# Patient Record
Sex: Male | Born: 1960 | Race: Black or African American | Hispanic: No | Marital: Single | Smoking: Never smoker
Health system: Southern US, Community
[De-identification: ages and names within clinical notes are randomized; demographics above are authoritative.]

## PROBLEM LIST (undated history)

## (undated) DIAGNOSIS — M109 Gout, unspecified: Secondary | ICD-10-CM

## (undated) DIAGNOSIS — K5792 Diverticulitis of intestine, part unspecified, without perforation or abscess without bleeding: Secondary | ICD-10-CM

## (undated) HISTORY — PX: COLON SURGERY: SHX602

## (undated) HISTORY — PX: ORIF ULNAR FRACTURE: SHX5417

---

## 2010-12-10 ENCOUNTER — Ambulatory Visit (INDEPENDENT_AMBULATORY_CARE_PROVIDER_SITE_OTHER): Payer: Self-pay

## 2010-12-10 ENCOUNTER — Inpatient Hospital Stay (INDEPENDENT_AMBULATORY_CARE_PROVIDER_SITE_OTHER)
Admission: RE | Admit: 2010-12-10 | Discharge: 2010-12-10 | Disposition: A | Discharge status: Home / Self Care | Payer: Self-pay | Source: Ambulatory Visit | Attending: Family Medicine | Admitting: Family Medicine

## 2010-12-10 DIAGNOSIS — S335XXA Sprain of ligaments of lumbar spine, initial encounter: Secondary | ICD-10-CM

## 2010-12-10 DIAGNOSIS — S139XXA Sprain of joints and ligaments of unspecified parts of neck, initial encounter: Secondary | ICD-10-CM

## 2010-12-10 DIAGNOSIS — M1990 Unspecified osteoarthritis, unspecified site: Secondary | ICD-10-CM

## 2010-12-10 DIAGNOSIS — S8000XA Contusion of unspecified knee, initial encounter: Secondary | ICD-10-CM

## 2013-02-07 ENCOUNTER — Emergency Department (HOSPITAL_COMMUNITY): Payer: Self-pay

## 2013-02-07 ENCOUNTER — Emergency Department (INDEPENDENT_AMBULATORY_CARE_PROVIDER_SITE_OTHER): Payer: Medicare Other

## 2013-02-07 ENCOUNTER — Encounter (HOSPITAL_COMMUNITY): Payer: Self-pay | Admitting: Emergency Medicine

## 2013-02-07 ENCOUNTER — Emergency Department (INDEPENDENT_AMBULATORY_CARE_PROVIDER_SITE_OTHER)
Admission: EM | Admit: 2013-02-07 | Discharge: 2013-02-07 | Disposition: A | Discharge status: Home / Self Care | Payer: Medicare Other | Source: Home / Self Care

## 2013-02-07 DIAGNOSIS — S20219A Contusion of unspecified front wall of thorax, initial encounter: Secondary | ICD-10-CM

## 2013-02-07 DIAGNOSIS — W050XXA Fall from non-moving wheelchair, initial encounter: Secondary | ICD-10-CM

## 2013-02-07 DIAGNOSIS — S20212A Contusion of left front wall of thorax, initial encounter: Secondary | ICD-10-CM

## 2013-02-07 HISTORY — DX: Diverticulitis of intestine, part unspecified, without perforation or abscess without bleeding: K57.92

## 2013-02-07 HISTORY — DX: Gout, unspecified: M10.9

## 2013-02-07 MED ORDER — TRAMADOL HCL 50 MG PO TABS: 50.0000 mg | ORAL_TABLET | Freq: Four times a day (QID) | ORAL | Status: DC | PRN

## 2013-02-07 MED ORDER — IBUPROFEN 600 MG PO TABS: 600.0000 mg | ORAL_TABLET | Freq: Four times a day (QID) | ORAL | Status: DC | PRN
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
## 2013-02-08 NOTE — ED Provider Notes (Signed)
Medical screening examination/treatment/procedure(s) were performed by resident physician or non-physician practitioner and as supervising physician I was immediately available for consultation/collaboration.   KINDL,JAMES DOUGLAS MD.   James D Kindl, MD 02/08/13 1841 

## 2013-09-23 ENCOUNTER — Emergency Department (INDEPENDENT_AMBULATORY_CARE_PROVIDER_SITE_OTHER)
Admission: EM | Admit: 2013-09-23 | Discharge: 2013-09-23 | Disposition: A | Discharge status: Home / Self Care | Payer: Medicare Other | Source: Home / Self Care

## 2013-09-23 ENCOUNTER — Encounter (HOSPITAL_COMMUNITY): Payer: Self-pay | Admitting: Emergency Medicine

## 2013-09-23 DIAGNOSIS — K08133 Complete loss of teeth due to caries, class III: Secondary | ICD-10-CM

## 2013-09-23 DIAGNOSIS — K089 Disorder of teeth and supporting structures, unspecified: Secondary | ICD-10-CM

## 2013-09-23 DIAGNOSIS — K0889 Other specified disorders of teeth and supporting structures: Secondary | ICD-10-CM

## 2013-09-23 DIAGNOSIS — K08103 Complete loss of teeth, unspecified cause, class III: Secondary | ICD-10-CM

## 2013-09-23 DIAGNOSIS — K08139 Complete loss of teeth due to caries, unspecified class: Secondary | ICD-10-CM

## 2013-09-23 MED ORDER — AMOXICILLIN 500 MG PO CAPS: 1000.0000 mg | ORAL_CAPSULE | Freq: Two times a day (BID) | ORAL | Status: DC

## 2013-09-23 MED ORDER — HYDROCODONE-ACETAMINOPHEN 5-325 MG PO TABS: 1.0000 | ORAL_TABLET | ORAL | Status: DC | PRN

## 2013-09-23 NOTE — ED Provider Notes (Signed)
Medical screening examination/treatment/procedure(s) were performed by non-physician practitioner and as supervising physician I was immediately available for consultation/collaboration.  Demarri Elie, M.D.  Eulis Salazar C Jaxsin Bottomley, MD 09/23/13 2100 

## 2013-09-23 NOTE — Discharge Instructions (Signed)
Dental Pain °A tooth ache may be caused by cavities (tooth decay). Cavities expose the nerve of the tooth to air and hot or cold temperatures. It may come from an infection or abscess (also called a boil or furuncle) around your tooth. It is also often caused by dental caries (tooth decay). This causes the pain you are having. °DIAGNOSIS  °Your caregiver can diagnose this problem by exam. °TREATMENT  °· If caused by an infection, it may be treated with medications which kill germs (antibiotics) and pain medications as prescribed by your caregiver. Take medications as directed. °· Only take over-the-counter or prescription medicines for pain, discomfort, or fever as directed by your caregiver. °· Whether the tooth ache today is caused by infection or dental disease, you should see your dentist as soon as possible for further care. °SEEK MEDICAL CARE IF: °The exam and treatment you received today has been provided on an emergency basis only. This is not a substitute for complete medical or dental care. If your problem worsens or new problems (symptoms) appear, and you are unable to meet with your dentist, call or return to this location. °SEEK IMMEDIATE MEDICAL CARE IF:  °· You have a fever. °· You develop redness and swelling of your face, jaw, or neck. °· You are unable to open your mouth. °· You have severe pain uncontrolled by pain medicine. °MAKE SURE YOU:  °· Understand these instructions. °· Will watch your condition. °· Will get help right away if you are not doing well or get worse. °Document Released: 08/27/2005 Document Revised: 11/19/2011 Document Reviewed: 04/14/2008 °ExitCare® Patient Information ©2014 [employer]. ° °Dental Care and Dentist Visits °Dental care supports good overall health. Regular dental visits can also help you avoid dental pain, bleeding, infection, and other more serious health problems in the future. It is important to keep the mouth healthy because diseases in the teeth, gums,  and other oral tissues can spread to other areas of the body. Some problems, such as diabetes, heart disease, and pre-term labor have been associated with poor oral health.  °See your dentist every 6 months. If you experience emergency problems such as a toothache or broken tooth, go to the dentist right away. If you see your dentist regularly, you may catch problems early. It is easier to be treated for problems in the early stages.  °WHAT TO EXPECT AT A DENTIST VISIT  °Your dentist will look for many common oral health problems and recommend proper treatment. At your regular dental visit, you can expect: °· Gentle cleaning of the teeth and gums. This includes scraping and polishing. This helps to remove the sticky substance around the teeth and gums (plaque). Plaque forms in the mouth shortly after eating. Over time, plaque hardens on the teeth as tartar. If tartar is not removed regularly, it can cause problems. Cleaning also helps remove stains. °· Periodic X-rays. These pictures of the teeth and supporting bone will help your dentist assess the health of your teeth. °· Periodic fluoride treatments. Fluoride is a natural mineral shown to help strengthen teeth. Fluoride treatment involves applying a fluoride gel or varnish to the teeth. It is most commonly done in children. °· Examination of the mouth, tongue, jaws, teeth, and gums to look for any oral health problems, such as: °· Cavities (dental caries). This is decay on the tooth caused by plaque, sugar, and acid in the mouth. It is best to catch a cavity when it is small. °· Inflammation of the gums   caused by plaque buildup (gingivitis).  Problems with the mouth or malformed or misaligned teeth.  Oral cancer or other diseases of the soft tissues or jaws. KEEP YOUR TEETH AND GUMS HEALTHY For healthy teeth and gums, follow these general guidelines as well as your dentist's specific advice:  Have your teeth professionally cleaned at the dentist every 6  months.  Brush twice daily with a fluoride toothpaste.  Floss your teeth daily.  Ask your dentist if you need fluoride supplements, treatments, or fluoride toothpaste.  Eat a healthy diet. Reduce foods and drinks with added sugar.  Avoid smoking. TREATMENT FOR ORAL HEALTH PROBLEMS If you have oral health problems, treatment varies depending on the conditions present in your teeth and gums.  Your caregiver will most likely recommend good oral hygiene at each visit.  For cavities, gingivitis, or other oral health disease, your caregiver will perform a procedure to treat the problem. This is typically done at a separate appointment. Sometimes your caregiver will refer you to another dental specialist for specific tooth problems or for surgery. SEEK IMMEDIATE DENTAL CARE IF:  You have pain, bleeding, or soreness in the gum, tooth, jaw, or mouth area.  A permanent tooth becomes loose or separated from the gum socket.  You experience a blow or injury to the mouth or jaw area. Document Released: 05/09/2011 Document Revised: 11/19/2011 Document Reviewed: 05/09/2011 [facility] Patient Information 2014 [place], [place].  Teeth and Gum Care Most problems with teeth and gums can be prevented if you do the following:  Brush your teeth.  Brush after each meal or at least 3 times a day with a soft bristle tooth brush.  Brush in a circular motion.  Brush close to your gums as well as your teeth.  Brush your back teeth longer than your front teeth.  Floss your teeth once a day. Before bedtime is a good time to floss.  Go to a dentist 2 times a year.  Eat a balanced diet.  Eat good meals including fruits, vegetables, fish, chicken or other meat, and milk products.  Drink 8 to 10 glasses of water a day.  Do not eat candy or snack foods.  Do not  drink sugar-sweetened soft drinks or juice.  If a problem develops, go to your dentist right away.  Common problems are cavities, bleeding  gums, sores or lumps in your mouth, or pain in your teeth, gums or jaws.  It is important to see a Dentist right away for these problems because you may lose your teeth or have a lot of pain if you do not go soon.  Take care of your child's teeth.  Brush the teeth with a soft bristle tooth brush.  Do not let your child eat or drink sweet foods and drinks.  Do not let your child fall asleep with a bottle in his or her mouth.  Take your child to a dentist when they are between 882 and 51 years old. This is the age you should start taking your child to a dentist. If your child has problems with their teeth, take them to a dentist sooner.  If a tooth is knocked out (yours or your child's):  Save the tooth if possible.  Wrap it in tissue or put it in a glass of milk.  Take it with you to your dentist right away. Document Released: 06/05/2008 Document Revised: 11/19/2011 Document Reviewed: 06/05/2008 [facility]Exit[facility] Patient Information 2014 [place]ExitCare, [place].

## 2013-09-23 NOTE — ED Provider Notes (Signed)
CSN: [id number]     Arrival date & time 09/23/13  0802 History   None    Chief Complaint  Patient presents with  . Dental Pain   (Consider location/radiation/quality/duration/timing/severity/associated sxs/prior Treatment) HPI Comments: 53 year old male presents with toothache. He has a history of having all of his upper teeth pulled as well as most of the lower teeth.   Past Medical History  Diagnosis Date  . Diverticulitis   . Gout    Past Surgical History  Procedure Laterality Date  . Orif ulnar fracture     History reviewed. No pertinent family history. History  Substance Use Topics  . Smoking status: Never Smoker   . Smokeless tobacco: Not on file  . Alcohol Use: Yes    Review of Systems  Constitutional: Negative.   HENT: Positive for dental problem.        T  Respiratory: Negative.   Psychiatric/Behavioral: Negative.     Allergies  Review of patient's allergies indicates no known allergies.  Home Medications   Current Outpatient Rx  Name  Route  Sig  Dispense  Refill  . amoxicillin (AMOXIL) 500 MG capsule   Oral   Take 2 capsules (1,000 mg total) by mouth 2 (two) times daily.   28 capsule   0   . HYDROcodone-acetaminophen (NORCO/VICODIN) 5-325 MG per tablet   Oral   Take 1 tablet by mouth every 4 (four) hours as needed.   15 tablet   0   . ibuprofen (ADVIL,MOTRIN) 600 MG tablet   Oral   Take 1 tablet (600 mg total) by mouth every 6 (six) hours as needed for pain.   24 tablet   0   . traMADol (ULTRAM) 50 MG tablet   Oral   Take 1 tablet (50 mg total) by mouth every 6 (six) hours as needed for pain.   20 tablet   0    BP 147/94  Pulse 86  Temp(Src) 97.6 F (36.4 C) (Oral)  Resp 16  SpO2 98% Physical Exam  Nursing note and vitals reviewed. Constitutional: He is oriented to person, place, and time. He appears well-developed and well-nourished. No distress.  HENT:  Mouth/Throat: Oropharynx is clear and moist. No oropharyngeal exudate.   The remaining  teeth on the bottom numbering approximately 6 are tender with multiple caries and stages of decay. Gingiva mildly erythematous. No specific area of abscess visualized.   Eyes: Conjunctivae and EOM are normal.  Lymphadenopathy:    He has no cervical adenopathy.  Neurological: He is alert and oriented to person, place, and time. He exhibits normal muscle tone.  Skin: Skin is warm and dry.  Psychiatric: He has a normal mood and affect.    ED Course  Procedures (including critical care time) Labs Review Labs Reviewed - No data to display Imaging Review No results found.    MDM   1. Odontalgia   2. Loss of teeth due to cavities, class III edentulism   3. Poor dentition    Followup with a dentist for definitive treatment. In the meantime he may see the doctor listed on her Medicaid card as he will be able to prescribe pain medicines and antibiotics if needed. Norco 5 mg #15 Amoxicillin 4 times a day for 7 days    Hayden Rasmussenavid Mykela Mewborn, NP 09/23/13 [id number]  Hayden Rasmussenavid Shallen Luedke, NP 09/23/13 1759

## 2013-09-23 NOTE — ED Notes (Signed)
C/o dental pain which started two days ago States his bottom teeth are rotten and old He has removed all the top teeth b/c of rotten teeth States he doesn't have a dentist.  Needs bottom six teeth replaced States he mouth is swollen

## 2014-02-12 ENCOUNTER — Encounter: Payer: Self-pay | Admitting: Family Medicine

## 2014-02-12 ENCOUNTER — Ambulatory Visit (INDEPENDENT_AMBULATORY_CARE_PROVIDER_SITE_OTHER): Payer: Medicare Other | Admitting: Family Medicine

## 2014-02-12 VITALS — BP 124/81 | HR 92 | Temp 99.3°F | Ht 72.0 in | Wt 214.0 lb

## 2014-02-12 DIAGNOSIS — K5732 Diverticulitis of large intestine without perforation or abscess without bleeding: Secondary | ICD-10-CM

## 2014-02-12 DIAGNOSIS — M109 Gout, unspecified: Secondary | ICD-10-CM

## 2014-02-12 DIAGNOSIS — R7309 Other abnormal glucose: Secondary | ICD-10-CM | POA: Insufficient documentation

## 2014-02-12 DIAGNOSIS — E7800 Pure hypercholesterolemia, unspecified: Secondary | ICD-10-CM

## 2014-02-12 DIAGNOSIS — E785 Hyperlipidemia, unspecified: Secondary | ICD-10-CM | POA: Insufficient documentation

## 2014-02-12 LAB — CBC WITH DIFFERENTIAL/PLATELET
BASOS ABS: 0 10*3/uL (ref 0.0–0.1)
Basophils Relative: 0 % (ref 0–1)
EOS PCT: 1 % (ref 0–5)
Eosinophils Absolute: 0.1 10*3/uL (ref 0.0–0.7)
HCT: 48 % (ref 39.0–52.0)
Hemoglobin: 17.3 g/dL — ABNORMAL HIGH (ref 13.0–17.0)
LYMPHS PCT: 24 % (ref 12–46)
Lymphs Abs: 2.4 10*3/uL (ref 0.7–4.0)
MCH: 31 pg (ref 26.0–34.0)
MCHC: 36 g/dL (ref 30.0–36.0)
MCV: 86 fL (ref 78.0–100.0)
Monocytes Absolute: 0.9 10*3/uL (ref 0.1–1.0)
Monocytes Relative: 9 % (ref 3–12)
NEUTROS ABS: 6.7 10*3/uL (ref 1.7–7.7)
Neutrophils Relative %: 66 % (ref 43–77)
PLATELETS: 276 10*3/uL (ref 150–400)
RBC: 5.58 MIL/uL (ref 4.22–5.81)
RDW: 14 % (ref 11.5–15.5)
WBC: 10.2 10*3/uL (ref 4.0–10.5)

## 2014-02-12 LAB — COMPREHENSIVE METABOLIC PANEL
ALK PHOS: 56 U/L (ref 39–117)
ALT: 29 U/L (ref 0–53)
AST: 18 U/L (ref 0–37)
Albumin: 4.8 g/dL (ref 3.5–5.2)
BUN: 9 mg/dL (ref 6–23)
CALCIUM: 10.2 mg/dL (ref 8.4–10.5)
CHLORIDE: 100 meq/L (ref 96–112)
CO2: 26 mEq/L (ref 19–32)
CREATININE: 1.02 mg/dL (ref 0.50–1.35)
Glucose, Bld: 126 mg/dL — ABNORMAL HIGH (ref 70–99)
Potassium: 4.5 mEq/L (ref 3.5–5.3)
Sodium: 138 mEq/L (ref 135–145)
Total Bilirubin: 0.6 mg/dL (ref 0.2–1.2)
Total Protein: 7.4 g/dL (ref 6.0–8.3)

## 2014-02-12 LAB — POCT GLYCOSYLATED HEMOGLOBIN (HGB A1C): Hemoglobin A1C: 6.8

## 2014-02-12 LAB — LDL CHOLESTEROL, DIRECT: Direct LDL: 61 mg/dL
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
## 2014-02-12 NOTE — Patient Instructions (Signed)

## 2014-02-15 ENCOUNTER — Encounter: Payer: Self-pay | Admitting: Family Medicine

## 2014-02-17 ENCOUNTER — Encounter: Payer: Self-pay | Admitting: Family Medicine

## 2014-02-17 DIAGNOSIS — K5732 Diverticulitis of large intestine without perforation or abscess without bleeding: Secondary | ICD-10-CM | POA: Insufficient documentation

## 2014-02-17 NOTE — Assessment & Plan Note (Signed)
History of elevated cholesterol as his other doctor. Never prescribed medication. Was told to watch diet. Will repeat cholesterol panel today

## 2014-02-17 NOTE — Assessment & Plan Note (Signed)
Patient states he has been without diverticular flare for quite some time. He has not had a colonoscopy. Have recommended colonoscopy considering his 53 years old.

## 2014-02-17 NOTE — Assessment & Plan Note (Signed)
Obtain A1c today. 

## 2014-02-17 NOTE — Assessment & Plan Note (Signed)
Seems to be controlled on naproxen.

## 2014-03-18 ENCOUNTER — Encounter: Payer: Self-pay | Admitting: Family Medicine

## 2014-03-18 DIAGNOSIS — IMO0002 Reserved for concepts with insufficient information to code with codable children: Secondary | ICD-10-CM | POA: Insufficient documentation

## 2014-03-18 DIAGNOSIS — S62102A Fracture of unspecified carpal bone, left wrist, initial encounter for closed fracture: Secondary | ICD-10-CM | POA: Insufficient documentation

## 2014-03-23 ENCOUNTER — Encounter: Payer: Self-pay | Admitting: Family Medicine

## 2014-03-23 ENCOUNTER — Ambulatory Visit (INDEPENDENT_AMBULATORY_CARE_PROVIDER_SITE_OTHER): Payer: Medicare Other | Admitting: Family Medicine

## 2014-03-23 ENCOUNTER — Telehealth: Payer: Self-pay | Admitting: Family Medicine

## 2014-03-23 VITALS — BP 149/90 | HR 99 | Temp 98.0°F | Ht 72.0 in | Wt 214.7 lb

## 2014-03-23 DIAGNOSIS — Z0271 Encounter for disability determination: Secondary | ICD-10-CM

## 2014-03-23 NOTE — Telephone Encounter (Signed)
Please call Mr. Clayton Kennedy and inform him his paperwork is completed and placed behind the desk for pick up.Thanks.

## 2014-03-23 NOTE — Progress Notes (Signed)
Patient ID: Clayton Kennedy, male   DOB: 03/01/1961, 53 y.o.   MRN: [id number] No charge for today's visit. Patient is here for disability paper work. We personally dop not compete disability paperwork. We gave him the resources [facility].

## 2014-03-23 NOTE — Telephone Encounter (Signed)
LVM for patient to call back. ?

## 2014-03-25 NOTE — Telephone Encounter (Signed)
Patient informed. 

## 2015-03-26 ENCOUNTER — Emergency Department (INDEPENDENT_AMBULATORY_CARE_PROVIDER_SITE_OTHER)
Admission: EM | Admit: 2015-03-26 | Discharge: 2015-03-26 | Disposition: A | Discharge status: Home / Self Care | Payer: Commercial Managed Care - HMO | Source: Home / Self Care | Attending: Family Medicine | Admitting: Family Medicine

## 2015-03-26 ENCOUNTER — Encounter (HOSPITAL_COMMUNITY): Payer: Self-pay | Admitting: *Deleted

## 2015-03-26 DIAGNOSIS — L03011 Cellulitis of right finger: Secondary | ICD-10-CM | POA: Diagnosis not present

## 2015-03-26 MED ORDER — PENTAFLUOROPROP-TETRAFLUOROETH EX AERO
INHALATION_SPRAY | CUTANEOUS | Status: AC
Start: 2015-03-26 — End: 2015-03-26
  Filled 2015-03-26: qty 103.5

## 2015-03-26 MED ORDER — AMOXICILLIN-POT CLAVULANATE 875-125 MG PO TABS: 1.0000 | ORAL_TABLET | Freq: Two times a day (BID) | ORAL | Status: DC

## 2015-03-26 NOTE — ED Provider Notes (Signed)
CSN: [id number]     Arrival date & time 03/26/15  1357 History   First MD Initiated Contact with Patient 03/26/15 1434     Chief Complaint  Patient presents with  . Hand Pain   (Consider location/radiation/quality/duration/timing/severity/associated sxs/prior Treatment) Patient is a 54 y.o. male presenting with hand pain. The history is provided by the patient.  Hand Pain This is a new problem. The current episode started more than 1 week ago. The problem has been gradually worsening.    Past Medical History  Diagnosis Date  . Diverticulitis   . Gout    Past Surgical History  Procedure Laterality Date  . Orif ulnar fracture    . Colon surgery      Partial colectomy for diverticulitis   Family History  Problem Relation Age of Onset  . Cancer Mother   . Alcohol abuse Father    History  Substance Use Topics  . Smoking status: Never Smoker   . Smokeless tobacco: Not on file  . Alcohol Use: Yes    Review of Systems  Constitutional: Negative.   Musculoskeletal: Positive for joint swelling.  Skin: Positive for wound.    Allergies  Review of patient's allergies indicates no known allergies.  Home Medications   Prior to Admission medications   Medication Sig Start Date End Date Taking? Authorizing Provider  amoxicillin (AMOXIL) 500 MG capsule Take 2 capsules (1,000 mg total) by mouth 2 (two) times daily. 09/23/13   Hayden Rasmussenavid Mabe, NP  amoxicillin-clavulanate (AUGMENTIN) 875-125 MG per tablet Take 1 tablet by mouth 2 (two) times daily. 03/26/15   Linna HoffJames D Jaqueline Uber, MD  HYDROcodone-acetaminophen (NORCO/VICODIN) 5-325 MG per tablet Take 1 tablet by mouth every 4 (four) hours as needed. 09/23/13   Hayden Rasmussenavid Mabe, NP  ibuprofen (ADVIL,MOTRIN) 600 MG tablet Take 1 tablet (600 mg total) by mouth every 6 (six) hours as needed for pain. 02/07/13   Hayden Rasmussenavid Mabe, NP  traMADol (ULTRAM) 50 MG tablet Take 1 tablet (50 mg total) by mouth every 6 (six) hours as needed for pain. 02/07/13   Hayden Rasmussenavid Mabe, NP    BP 142/88 mmHg  Pulse 92  Temp(Src) 98.2 F (36.8 C) (Oral)  Resp 18  SpO2 96% Physical Exam  Constitutional: He is oriented to person, place, and time. He appears well-developed and well-nourished.  Musculoskeletal: He exhibits tenderness.  Abscess to right thumb cuticle area.  Neurological: He is alert and oriented to person, place, and time.  Skin: Skin is warm and dry. There is erythema.  Nursing note and vitals reviewed.   ED Course  INCISION AND DRAINAGE Date/Time: 03/26/2015 3:36 PM Performed by: Linna HoffKINDL, Dereck Agerton D Authorized by: Bradd CanaryKINDL, Taige Housman D Consent: Verbal consent obtained. Consent given by: patient Type: abscess Body area: upper extremity Location details: right thumb Local anesthetic: topical anesthetic Patient sedated: no Scalpel size: 11 Incision type: single straight Complexity: simple Drainage: purulent Drainage amount: moderate Wound treatment: wound left open Patient tolerance: Patient tolerated the procedure well with no immediate complications Comments: Culture, obtained, betadine soaked   (including critical care time) Labs Review Labs Reviewed  CULTURE, ROUTINE-ABSCESS    Imaging Review No results found.   MDM   1. Acute paronychia of thumb, right        Linna HoffJames D Leanah Kolander, MD 03/26/15 1540

## 2015-03-26 NOTE — ED Notes (Signed)
Paronychia noted to right thumb; pain, swelling started 7/7.  Has tried soaking in H2O2 and Epsom salt.

## 2015-03-26 NOTE — Discharge Instructions (Signed)
Soak twice a day for 5 days in warm water, take all of medicine, return if any problems. °

## 2015-03-29 LAB — CULTURE, ROUTINE-ABSCESS: Gram Stain: NONE SEEN

## 2015-04-01 NOTE — ED Notes (Signed)
Discussed final report w Dr Artis Flock, who felt treatment was adequate

## 2015-06-29 ENCOUNTER — Encounter: Payer: Self-pay | Admitting: Family Medicine

## 2015-06-29 ENCOUNTER — Ambulatory Visit (INDEPENDENT_AMBULATORY_CARE_PROVIDER_SITE_OTHER): Payer: Commercial Managed Care - HMO | Admitting: Family Medicine

## 2015-06-29 DIAGNOSIS — Z794 Long term (current) use of insulin: Secondary | ICD-10-CM

## 2015-06-29 DIAGNOSIS — E118 Type 2 diabetes mellitus with unspecified complications: Secondary | ICD-10-CM

## 2015-06-29 DIAGNOSIS — Z0000 Encounter for general adult medical examination without abnormal findings: Secondary | ICD-10-CM | POA: Diagnosis not present

## 2015-06-29 DIAGNOSIS — R030 Elevated blood-pressure reading, without diagnosis of hypertension: Secondary | ICD-10-CM

## 2015-06-29 DIAGNOSIS — IMO0001 Reserved for inherently not codable concepts without codable children: Secondary | ICD-10-CM

## 2015-06-29 DIAGNOSIS — D751 Secondary polycythemia: Secondary | ICD-10-CM | POA: Diagnosis not present

## 2015-06-29 DIAGNOSIS — I10 Essential (primary) hypertension: Secondary | ICD-10-CM | POA: Insufficient documentation

## 2015-06-29 LAB — TSH: TSH: 0.181 u[IU]/mL — ABNORMAL LOW (ref 0.350–4.500)

## 2015-06-29 LAB — LIPID PANEL
CHOL/HDL RATIO: 4.8 ratio (ref <=5.0)
CHOLESTEROL: 208 mg/dL — AB (ref 125–200)
HDL: 43 mg/dL (ref >=40)
LDL Cholesterol: 114 mg/dL (ref <130)
Triglycerides: 256 mg/dL — ABNORMAL HIGH (ref <150)
VLDL: 51 mg/dL — ABNORMAL HIGH (ref <30)

## 2015-06-29 LAB — CBC WITH DIFFERENTIAL/PLATELET
BASOS PCT: 0 % (ref 0–1)
Basophils Absolute: 0 10*3/uL (ref 0.0–0.1)
EOS ABS: 0 10*3/uL (ref 0.0–0.7)
Eosinophils Relative: 0 % (ref 0–5)
HCT: 46.6 % (ref 39.0–52.0)
HEMOGLOBIN: 16.2 g/dL (ref 13.0–17.0)
LYMPHS ABS: 2 10*3/uL (ref 0.7–4.0)
Lymphocytes Relative: 20 % (ref 12–46)
MCH: 30.6 pg (ref 26.0–34.0)
MCHC: 34.8 g/dL (ref 30.0–36.0)
MCV: 88.1 fL (ref 78.0–100.0)
MONO ABS: 0.7 10*3/uL (ref 0.1–1.0)
MONOS PCT: 7 % (ref 3–12)
MPV: 10.7 fL (ref 8.6–12.4)
Neutro Abs: 7.3 10*3/uL (ref 1.7–7.7)
Neutrophils Relative %: 73 % (ref 43–77)
PLATELETS: 317 10*3/uL (ref 150–400)
RBC: 5.29 MIL/uL (ref 4.22–5.81)
RDW: 13.2 % (ref 11.5–15.5)
WBC: 10 10*3/uL (ref 4.0–10.5)

## 2015-06-29 LAB — COMPLETE METABOLIC PANEL WITH GFR
ALBUMIN: 4.9 g/dL (ref 3.6–5.1)
ALK PHOS: 62 U/L (ref 40–115)
ALT: 20 U/L (ref 9–46)
AST: 15 U/L (ref 10–35)
BUN: 10 mg/dL (ref 7–25)
CALCIUM: 9.8 mg/dL (ref 8.6–10.3)
CO2: 22 mmol/L (ref 20–31)
Chloride: 99 mmol/L (ref 98–110)
Creat: 0.9 mg/dL (ref 0.70–1.33)
GFR, Est African American: >89 mL/min (ref >=60)
Glucose, Bld: 292 mg/dL — ABNORMAL HIGH (ref 65–99)
POTASSIUM: 4.1 mmol/L (ref 3.5–5.3)
SODIUM: 137 mmol/L (ref 135–146)
Total Bilirubin: 0.6 mg/dL (ref 0.2–1.2)
Total Protein: 7.5 g/dL (ref 6.1–8.1)

## 2015-06-29 LAB — POCT GLYCOSYLATED HEMOGLOBIN (HGB A1C): HEMOGLOBIN A1C: 12.6

## 2015-06-29 NOTE — Patient Instructions (Addendum)
Thank you for coming to see me today. It was a pleasure. Today we talked about:   Pityriasis: this is the rash that is on your chest. This may resolve on tis own but should not cause you any issues. If you start becoming symptomatic with itching, we can start some medication.  Chest pain: this appears more likely abdominal pain. The area that you describe makes me think this could be related to your pancreas. I suggest decreasing your alcohol intake.   Please make an appointment to see me for follow-up as needed or in one year; sooner depending on your results  If you have any questions or concerns, please do not hesitate to call the office at [phone].  Sincerely,  Jacquelin Hawkingalph Adasha Boehme, MD

## 2015-06-29 NOTE — Progress Notes (Signed)
Subjective    Clayton Kennedy is a 54 y.o. male that presents for yearly physical exam.   Concerns:  1. Chest pain: Symptoms started about 1.5 months ago. Pain is stabbing and feels like he is a little out of breath. Pain is actually located LUQ abdomen. Pain does not radiate. He has been going to the gym more often, doing exercises like push ups. He reports that pain hits him out of the blue. He does not have any specific precipitating events. Symptoms improved with water.   2. Rash: Symptoms started two months ago. Rash is macular and hyperpigmented with confluent parts. No itching or pain. No fevers.  3. Testicle vibration: Symptoms lasted for a few days and went away. No swelling, dysuria or penile discharge.  Goals    None      Past Medical History  Diagnosis Date  . Diverticulitis   . Gout     Past Surgical History  Procedure Laterality Date  . Orif ulnar fracture    . Colon surgery      Partial colectomy for diverticulitis    Current Outpatient Prescriptions on File Prior to Visit  Medication Sig Dispense Refill  . amoxicillin (AMOXIL) 500 MG capsule Take 2 capsules (1,000 mg total) by mouth 2 (two) times daily. 28 capsule 0  . amoxicillin-clavulanate (AUGMENTIN) 875-125 MG per tablet Take 1 tablet by mouth 2 (two) times daily. 14 tablet 0  . HYDROcodone-acetaminophen (NORCO/VICODIN) 5-325 MG per tablet Take 1 tablet by mouth every 4 (four) hours as needed. 15 tablet 0  . ibuprofen (ADVIL,MOTRIN) 600 MG tablet Take 1 tablet (600 mg total) by mouth every 6 (six) hours as needed for pain. 24 tablet 0  . traMADol (ULTRAM) 50 MG tablet Take 1 tablet (50 mg total) by mouth every 6 (six) hours as needed for pain. 20 tablet 0   No current facility-administered medications on file prior to visit.    No Known Allergies  Social History   Social History  . Marital Status: Single    Spouse Name: N/A  . Number of Children: N/A  . Years of Education: N/A   Social History  Main Topics  . Smoking status: Never Smoker   . Smokeless tobacco: None  . Alcohol Use: 25.2 oz/week    42 Cans of beer per week  . Drug Use: No  . Sexual Activity: Yes   Other Topics Concern  . None   Social History Narrative    Family History  Problem Relation Age of Onset  . Cancer Mother   . Alcohol abuse Father     ROS  Per HPI   Objective   There were no vitals taken for this visit.  General: Well appearing, no distress HEENT: Pupils equal and reactive to light/accomodation. Extraocular movements intact bilaterally. Tympanic membranes normal bilaterally. Nares patent bilaterally. Oropharnx clear and moist. No cervical adenopathy bilaterally Respiratory/Chest: Clear to auscultation bilaterally Cardiovascular: Regular rate and rhythm, no murmur Gastrointestinal: Soft, epigastric tenderness, non-distended Genitourinary: Testicles without tenderness. No testicular swelling. Penis is circumcised and appears normal.    Musculoskeletal: normal bulk and tone Neuro: Alert,oriented, 5/5 strength upper/lower extremities Dermatologic: Erythematous macules on chest and upper abdomen Psychiatric: Normal affect  No orders of the defined types were placed in this encounter.    Assessment and Plan    Health Maintenance Due  Topic Date Due  . Hepatitis C Screening  1960/12/16  . HIV Screening  08/16/1978  . TETANUS/TDAP  08/16/1982  .  COLONOSCOPY  08/16/2013  . INFLUENZA VACCINE  04/11/2015   Orders Placed This Encounter  Procedures  . CBC with Differential  . COMPLETE METABOLIC PANEL WITH GFR  . Lipid panel  . TSH  . POCT A1C    Pityriasis - reassurance  Healthcare maintenance - labs: CBC, CMP, Lipid Panel, TSH, A1C

## 2015-07-01 ENCOUNTER — Telehealth: Payer: Self-pay | Admitting: Family Medicine

## 2015-07-01 ENCOUNTER — Telehealth: Payer: Self-pay

## 2015-07-01 NOTE — Telephone Encounter (Signed)
Called patient and informed him that the form he needed filled out and faxed to [employer] had not been received.  He said that he would call them and remind them to refax it here. I gave him the fax number here.  The form needs to be filled out by Dr. Caleb PoppNettey and faxed to [phone] attn: Guadlupe SpanishKeith McQuaid (claims).Glennie HawkSimpson, Michelle R

## 2015-07-01 NOTE — Telephone Encounter (Signed)
Called patient and left message regarding attempt to discuss recent lab results.

## 2015-07-01 NOTE — Telephone Encounter (Signed)
Pt called and would like to get his lab results. jw

## 2015-07-05 NOTE — Telephone Encounter (Signed)
Called patient. Left message. Recommend patient make an appointment to see me. As of right now, I have appointments available this week. We can discuss results at that time as there are some results that will be best addressed face to face.

## 2015-07-05 NOTE — Telephone Encounter (Signed)
Formed filled and placed for fax 07/04/2015. Please inform patient.

## 2015-07-06 NOTE — Telephone Encounter (Signed)
Contacted pt and gave him below message and scheduled him an appointment on Monday 07/11/15. Lamonte SakaiZimmerman Rumple, Jerone Cudmore D, [place]

## 2015-07-06 NOTE — Telephone Encounter (Signed)
PT informed. Zimmerman Rumple, April D, CMA  

## 2015-07-11 ENCOUNTER — Ambulatory Visit (INDEPENDENT_AMBULATORY_CARE_PROVIDER_SITE_OTHER): Payer: Commercial Managed Care - HMO | Admitting: Family Medicine

## 2015-07-11 ENCOUNTER — Encounter: Payer: Self-pay | Admitting: Family Medicine

## 2015-07-11 VITALS — BP 140/87 | HR 94 | Temp 98.2°F | Ht 72.0 in | Wt 193.0 lb

## 2015-07-11 DIAGNOSIS — E7800 Pure hypercholesterolemia, unspecified: Secondary | ICD-10-CM | POA: Diagnosis not present

## 2015-07-11 DIAGNOSIS — N529 Male erectile dysfunction, unspecified: Secondary | ICD-10-CM

## 2015-07-11 DIAGNOSIS — R946 Abnormal results of thyroid function studies: Secondary | ICD-10-CM

## 2015-07-11 DIAGNOSIS — E1165 Type 2 diabetes mellitus with hyperglycemia: Secondary | ICD-10-CM | POA: Diagnosis not present

## 2015-07-11 DIAGNOSIS — R7989 Other specified abnormal findings of blood chemistry: Secondary | ICD-10-CM

## 2015-07-11 LAB — T3, FREE: T3, Free: 2.8 pg/mL (ref 2.3–4.2)

## 2015-07-11 LAB — T4, FREE: Free T4: 1.13 ng/dL (ref 0.80–1.80)

## 2015-07-11 MED ORDER — SITAGLIPTIN PHOSPHATE 100 MG PO TABS: 100.0000 mg | ORAL_TABLET | Freq: Every day | ORAL | Status: DC

## 2015-07-11 MED ORDER — SILDENAFIL CITRATE 50 MG PO TABS: 50.0000 mg | ORAL_TABLET | Freq: Every day | ORAL | Status: DC | PRN

## 2015-07-11 MED ORDER — METFORMIN HCL 850 MG PO TABS: 850.0000 mg | ORAL_TABLET | Freq: Every day | ORAL | Status: DC

## 2015-07-11 NOTE — Progress Notes (Signed)
    Subjective   Clayton Kennedy is a 54 y.o. male that presents for a same day visit  1. Erectile dysfunction: Symptoms have been present for a few months. This is associated with a lot of stress. He reports trouble initiating and maintaining an erection. He reports not being very happy in his relationship as they are getting into a lot of fights, especially around the issue of sex. He has no issues ejaculating but does say it takes a while. His desire is present. He reports no history of chest pain.  2. Low TSH: Previously seen on obtained lab. Patient reports not complaints of palpitations, chest pain, diaphoresis.  3. Diabetes mellitus: New diagnosis. Patient reports polydipsia and polyuria. Patient states he does not want to start on insulin. He is okay starting on oral medications for glycemic control.   ROS Per HPI  Social History  Substance Use Topics  . Smoking status: Never Smoker   . Smokeless tobacco: None  . Alcohol Use: 25.2 oz/week    42 Cans of beer per week    No Known Allergies  Objective   BP 140/87 mmHg  Pulse 94  Temp(Src) 98.2 F (36.8 C) (Oral)  Ht 6' (1.829 m)  Wt 193 lb (87.544 kg)  BMI 26.17 kg/m2  General: Well appearing, no distress HEENT: No thyromegaly  Assessment and Plan   Meds ordered this encounter  Medications  . metFORMIN (GLUCOPHAGE) 850 MG tablet    Sig: Take 1 tablet (850 mg total) by mouth daily with breakfast.    Dispense:  60 tablet    Refill:  2  . sitaGLIPtin (JANUVIA) 100 MG tablet    Sig: Take 1 tablet (100 mg total) by mouth daily.    Dispense:  30 tablet    Refill:  2  . sildenafil (VIAGRA) 50 MG tablet    Sig: Take 1 tablet (50 mg total) by mouth daily as needed for erectile dysfunction.    Dispense:  10 tablet    Refill:  0   Uncontrolled type 2 diabetes mellitus with hyperglycemia, without long-term current use of insulin (HCC) Patient adamant about not starting insulin. Discussed disease process and benefits of  insulin. Instead, will provide oral treatment with metformin 850mg  qd and Januvia 100mg . Will follow-up in one month and repeat A1C three months from previous. Discussed checking CBGs qd as fasting AM  Low TSH level Asymptomatic. Will obtain T3 and T4  Erectile dysfunction Will trial Viagra 50mg  qd prn for sexual intercourse.

## 2015-07-11 NOTE — Patient Instructions (Addendum)
Diabetes: we discussed starting these medications and their side effects. You declined insulin at this time.  Thyroid: I am checking some labs  Erectile dysfunction: I am starting Viagra. Take 26mn before sex. If erection for more than 4 hours, go straight to the emergency department   Metformin extended-release tablets What is this medicine? METFORMIN (met FOR min) is used to treat type 2 diabetes. It helps to control blood sugar. Treatment is combined with diet and exercise. This medicine can be used alone or with other medicines for diabetes. This medicine may be used for other purposes; ask your health care provider or pharmacist if you have questions. What should I tell my health care provider before I take this medicine? They need to know if you have any of these conditions: -anemia -frequently drink alcohol-containing beverages -become easily dehydrated -heart attack -heart failure -kidney disease -liver disease -polycystic ovary syndrome -serious infection or injury -vomiting -an unusual or allergic reaction to metformin, other medicines, foods, dyes, or preservatives -pregnant or trying to get pregnant -breast-feeding How should I use this medicine? Take this medicine by mouth with a glass of water. Take it with meals. Swallow whole, do not crush or chew. Follow the directions on the prescription label. Take your medicine at regular intervals. Do not take your medicine more often than directed. Talk to your pediatrician regarding the use of this medicine in children. Special care may be needed. Overdosage: If you think you have taken too much of this medicine contact a poison control center or emergency room at once. NOTE: This medicine is only for you. Do not share this medicine with others. What if I miss a dose? If you miss a dose, take it as soon as you can. If it is almost time for your next dose, take only that dose. Do not take double or extra doses. What may  interact with this medicine? Do not take this medicine with any of the following medications: -dofetilide -gatifloxacin -certain contrast medicines given before X-rays, CT scans, MRI, or other procedures This medicine may also interact with the following medications: -acetazolamide -certain medicines for HIV infection or hepatitis, like adefovir, emtricitabine, entecavir, lamivudine, or tenofovir -cimetidine -crizotinib -digoxin -diuretics -male hormones, like estrogens or progestins and birth control pills -glycopyrrolate -isoniazid -lamotrigine -medicines for blood pressure, heart disease, irregular heart beat -memantine -midodrine -methazolamide -morphine -nicotinic acid -phenothiazines like chlorpromazine, mesoridazine, prochlorperazine, thioridazine -phenytoin -procainamide -propantheline -quinidine -quinine -ranitidine -ranolazine -steroid medicines like prednisone or cortisone -stimulant medicines for attention disorders, weight loss, or to stay awake -thyroid medicines -topiramate -trimethoprim -trospium -vancomycin -vandetanib -zonisamide This list may not describe all possible interactions. Give your health care provider a list of all the medicines, herbs, non-prescription drugs, or dietary supplements you use. Also tell them if you smoke, drink alcohol, or use illegal drugs. Some items may interact with your medicine. What should I watch for while using this medicine? Visit your doctor or health care professional for regular checks on your progress. A test called the HbA1C (A1C) will be monitored. This is a simple blood test. It measures your blood sugar control over the last 2 to 3 months. You will receive this test every 3 to 6 months. Learn how to check your blood sugar. Learn the symptoms of low and high blood sugar and how to manage them. Always carry a quick-source of sugar with you in case you have symptoms of low blood sugar. Examples include hard  sugar candy or glucose tablets. Make sure  others know that you can choke if you eat or drink when you develop serious symptoms of low blood sugar, such as seizures or unconsciousness. They must get medical help at once. Tell your doctor or health care professional if you have high blood sugar. You might need to change the dose of your medicine. If you are sick or exercising more than usual, you might need to change the dose of your medicine. Do not skip meals. Ask your doctor or health care professional if you should avoid alcohol. Many nonprescription cough and cold products contain sugar or alcohol. These can affect blood sugar. This medicine may cause ovulation in premenopausal women who do not have regular monthly periods. This may increase your chances of becoming pregnant. You should not take this medicine if you become pregnant or think you may be pregnant. Talk with your doctor or health care professional about your birth control options while taking this medicine. Contact your doctor or health care professional right away if think you are pregnant. The tablet shell for some brands of this medicine does not dissolve. This is normal. The tablet shell may appear whole in the stool. This is not a cause for concern. If you are going to need surgery, a MRI, CT scan, or other procedure, tell your doctor that you are taking this medicine. You may need to stop taking this medicine before the procedure. Wear a medical ID bracelet or chain, and carry a card that describes your disease and details of your medicine and dosage times. What side effects may I notice from receiving this medicine? Side effects that you should report to your doctor or health care professional as soon as possible: -allergic reactions like skin rash, itching or hives, swelling of the face, lips, or tongue -breathing problems -feeling faint or lightheaded, falls -muscle aches or pains -signs and symptoms of low blood sugar such as  feeling anxious, confusion, dizziness, increased hunger, unusually weak or tired, sweating, shakiness, cold, irritable, headache, blurred vision, fast heartbeat, loss of consciousness -slow or irregular heartbeat -unusual stomach pain or discomfort -unusually tired or weak Side effects that usually do not require medical attention (report to your doctor or health care professional if they continue or are bothersome): -diarrhea -headache -heartburn -metallic taste in mouth -nausea -stomach gas, upset This list may not describe all possible side effects. Call your doctor for medical advice about side effects. You may report side effects to FDA at 1-800-FDA-1088. Where should I keep my medicine? Keep out of the reach of children. Store at room temperature between 15 and 30 degrees C (59 and 86 degrees F). Protect from light. Throw away any unused medicine after the expiration date. NOTE: This sheet is a summary. It may not cover all possible information. If you have questions about this medicine, talk to your doctor, pharmacist, or health care provider.    2016, Elsevier/[employer] Standard. (2014-02-09 22:12:16)  Sitagliptin oral tablet What is this medicine? SITAGLIPTIN (sit a GLIP tin) helps to treat type 2 diabetes. It helps to control blood sugar. Treatment is combined with diet and exercise. This medicine may be used for other purposes; ask your health care provider or pharmacist if you have questions. What should I tell my health care provider before I take this medicine? They need to know if you have any of these conditions: -diabetic ketoacidosis -kidney disease -pancreatitis -previous swelling of the tongue, face, or lips with difficulty breathing, difficulty swallowing, hoarseness, or tightening of the throat -type 1  diabetes -an unusual or allergic reaction to sitagliptin, other medicines, foods, dyes, or preservatives -pregnant or trying to get pregnant -breast-feeding How  should I use this medicine? Take this medicine by mouth with a glass of water. Follow the directions on the prescription label. You can take it with or without food. Do not cut, crush or chew this medicine. Take your dose at the same time each day. Do not take more often than directed. Do not stop taking except on your doctor's advice. Talk to your pediatrician regarding the use of this medicine in children. Special care may be needed. Overdosage: If you think you have taken too much of this medicine contact a poison control center or emergency room at once. NOTE: This medicine is only for you. Do not share this medicine with others. What if I miss a dose? If you miss a dose, take it as soon as you can. If it is almost time for your next dose, take only that dose. Do not take double or extra doses. What may interact with this medicine? Do not take this medicine with any of the following medications: -gatifloxacin This medicine may also interact with the following medications: -alcohol -digoxin -insulin -sulfonylureas like glimepiride, glipizide, glyburide This list may not describe all possible interactions. Give your health care provider a list of all the medicines, herbs, non-prescription drugs, or dietary supplements you use. Also tell them if you smoke, drink alcohol, or use illegal drugs. Some items may interact with your medicine. What should I watch for while using this medicine? Visit your doctor or health care professional for regular checks on your progress. A test called the HbA1C (A1C) will be monitored. This is a simple blood test. It measures your blood sugar control over the last 2 to 3 months. You will receive this test every 3 to 6 months. Learn how to check your blood sugar. Learn the symptoms of low and high blood sugar and how to manage them. Always carry a quick-source of sugar with you in case you have symptoms of low blood sugar. Examples include hard sugar candy or glucose  tablets. Make sure others know that you can choke if you eat or drink when you develop serious symptoms of low blood sugar, such as seizures or unconsciousness. They must get medical help at once. Tell your doctor or health care professional if you have high blood sugar. You might need to change the dose of your medicine. If you are sick or exercising more than usual, you might need to change the dose of your medicine. Do not skip meals. Ask your doctor or health care professional if you should avoid alcohol. Many nonprescription cough and cold products contain sugar or alcohol. These can affect blood sugar. Wear a medical ID bracelet or chain, and carry a card that describes your disease and details of your medicine and dosage times. What side effects may I notice from receiving this medicine? Side effects that you should report to your doctor or health care professional as soon as possible: -allergic reactions like skin rash, itching or hives, swelling of the face, lips, or tongue -breathing problems -fever, chills -joint pain -loss of appetite -signs and symptoms of low blood sugar such as feeling anxious, confusion, dizziness, increased hunger, unusually weak or tired, sweating, shakiness, cold, irritable, headache, blurred vision, fast heartbeat, loss of consciousness -unusual stomach pain or discomfort -vomiting Side effects that usually do not require medical attention (report to your doctor or health care professional  if they continue or are bothersome): -diarrhea -headache -sore throat -stomach upset -stuffy or runny nose This list may not describe all possible side effects. Call your doctor for medical advice about side effects. You may report side effects to FDA at 1-800-FDA-1088. Where should I keep my medicine? Keep out of the reach of children. Store at room temperature between 15 and 30 degrees C (59 and 86 degrees F). Throw away any unused medicine after the expiration  date. NOTE: This sheet is a summary. It may not cover all possible information. If you have questions about this medicine, talk to your doctor, pharmacist, or health care provider.    2016, Else[employer] Standard. (2014-05-07 17:46:21)  Sildenafil tablets (Viagra) What is this medicine? SILDENAFIL (sil DEN a fil) is used to treat erection problems in men. This medicine may be used for other purposes; ask your health care provider or pharmacist if you have questions. What should I tell my health care provider before I take this medicine? They need to know if you have any of these conditions: -bleeding disorders -eye or vision problems, including a rare inherited eye disease called retinitis pigmentosa -anatomical deformation of the penis, Peyronie's disease, or history of priapism (painful and prolonged erection) -heart disease, angina, a history of heart attack, irregular heart beats, or other heart problems -high or low blood pressure -history of blood diseases, like sickle cell anemia or leukemia -history of stomach bleeding -kidney disease -liver disease -stroke -an unusual or allergic reaction to sildenafil, other medicines, foods, dyes, or preservatives -pregnant or trying to get pregnant -breast-feeding How should I use this medicine? Take this medicine by mouth with a glass of water. Follow the directions on the prescription label. The dose is usually taken 1 hour before sexual activity. You should not take the dose more than once per day. Do not take your medicine more often than directed. Talk to your pediatrician regarding the use of this medicine in children. This medicine is not used in children for this condition. Overdosage: If you think you have taken too much of this medicine contact a poison control center or emergency room at once. NOTE: This medicine is only for you. Do not share this medicine with others. What if I miss a dose? This does not apply. Do not take  double or extra doses. What may interact with this medicine? Do not take this medicine with any of the following medications: -cisapride -methscopolamine nitrate -nitrates like amyl nitrite, isosorbide dinitrate, isosorbide mononitrate, nitroglycerin -nitroprusside -other medicines for erectile dysfunction like avanafil, tadalafil, vardenafil -riociguat -other sildenafil products (Revatio) This medicine may also interact with the following medications: -certain drugs for high blood pressure -certain drugs for the treatment of HIV infection or AIDS -certain drugs used for fungal or yeast infections, like fluconazole, itraconazole, ketoconazole, and voriconazole -cimetidine -erythromycin -rifampin This list may not describe all possible interactions. Give your health care provider a list of all the medicines, herbs, non-prescription drugs, or dietary supplements you use. Also tell them if you smoke, drink alcohol, or use illegal drugs. Some items may interact with your medicine. What should I watch for while using this medicine? If you notice any changes in your vision while taking this drug, call your doctor or health care professional as soon as possible. Stop using this medicine and call your health care provider right away if you have a loss of sight in one or both eyes. Contact your doctor or health care professional right away if you have  an erection that lasts longer than 4 hours or if it becomes painful. This may be a sign of a serious problem and must be treated right away to prevent permanent damage. If you experience symptoms of nausea, dizziness, chest pain or arm pain upon initiation of sexual activity after taking this medicine, you should refrain from further activity and call your doctor or health care professional as soon as possible. Do not drink alcohol to excess (examples, 5 glasses of wine or 5 shots of whiskey) when taking this medicine. When taken in excess, alcohol can  increase your chances of getting a headache or getting dizzy, increasing your heart rate or lowering your blood pressure. Using this medicine does not protect you or your partner against HIV infection (the virus that causes AIDS) or other sexually transmitted diseases. What side effects may I notice from receiving this medicine? Side effects that you should report to your doctor or health care professional as soon as possible: -allergic reactions like skin rash, itching or hives, swelling of the face, lips, or tongue -breathing problems -changes in hearing -changes in vision -chest pain -fast, irregular heartbeat -prolonged or painful erection -seizures Side effects that usually do not require medical attention (report to your doctor or health care professional if they continue or are bothersome): -back pain -dizziness -flushing -headache -indigestion -muscle aches -nausea -stuffy or runny nose This list may not describe all possible side effects. Call your doctor for medical advice about side effects. You may report side effects to FDA at 1-800-FDA-1088. Where should I keep my medicine? Keep out of reach of children. Store at room temperature between 15 and 30 degrees C (59 and 86 degrees F). Throw away any unused medicine after the expiration date. NOTE: This sheet is a summary. It may not cover all possible information. If you have questions about this medicine, talk to your doctor, pharmacist, or health care provider.    2016, Elsevier/[employer] Standard. (2014-01-15 13:19:04)

## 2015-07-12 ENCOUNTER — Encounter: Payer: Self-pay | Admitting: Family Medicine

## 2015-07-12 DIAGNOSIS — N529 Male erectile dysfunction, unspecified: Secondary | ICD-10-CM | POA: Insufficient documentation

## 2015-07-12 DIAGNOSIS — R7989 Other specified abnormal findings of blood chemistry: Secondary | ICD-10-CM | POA: Insufficient documentation

## 2015-07-12 DIAGNOSIS — E119 Type 2 diabetes mellitus without complications: Secondary | ICD-10-CM | POA: Insufficient documentation

## 2015-07-12 NOTE — Assessment & Plan Note (Signed)
Elevated LDL>100 in a diabetic at age 54. Warrants medium-high intensity statin. Patient not interested at this time because of all the medication he is starting.

## 2015-07-12 NOTE — Assessment & Plan Note (Signed)
Patient adamant about not starting insulin. Discussed disease process and benefits of insulin. Instead, will provide oral treatment with metformin 850mg  qd and Januvia 100mg . Will follow-up in one month and repeat A1C three months from previous. Discussed checking CBGs qd as fasting AM

## 2015-07-12 NOTE — Assessment & Plan Note (Signed)
Will trial Viagra 50mg  qd prn for sexual intercourse.

## 2015-07-12 NOTE — Assessment & Plan Note (Signed)
Asymptomatic. Will obtain T3 and T4

## 2015-07-19 ENCOUNTER — Telehealth: Payer: Self-pay | Admitting: Family Medicine

## 2015-07-19 NOTE — Telephone Encounter (Signed)
Pt states insurance company faxed forms 2 weeks ago ( it is verifed the forms were received)  to be completed and faxed to his insurance company. His insurance company says the forms have never been received.  The forms were from [employer] company. These need to be received immediately so his policy will not be dropped. Please advise  Please have Dr Caleb PoppNettey call pt.  He wants to discuss diabetes medications

## 2015-07-20 NOTE — Telephone Encounter (Signed)
Found forms, re-faxing Sunday SpillersSharon T Kennedy, CMA

## 2015-07-21 NOTE — Telephone Encounter (Signed)
Confirmed Fax number with patient. Forms faxed again. Patient will check with life insurance company in about an hour to make sure they received it.

## 2015-08-10 ENCOUNTER — Ambulatory Visit (INDEPENDENT_AMBULATORY_CARE_PROVIDER_SITE_OTHER): Payer: Commercial Managed Care - HMO | Admitting: Family Medicine

## 2015-08-10 ENCOUNTER — Encounter: Payer: Self-pay | Admitting: Family Medicine

## 2015-08-10 VITALS — Temp 97.8°F | Ht 72.0 in | Wt 195.0 lb

## 2015-08-10 DIAGNOSIS — Z202 Contact with and (suspected) exposure to infections with a predominantly sexual mode of transmission: Secondary | ICD-10-CM

## 2015-08-10 DIAGNOSIS — E1165 Type 2 diabetes mellitus with hyperglycemia: Secondary | ICD-10-CM | POA: Diagnosis not present

## 2015-08-10 DIAGNOSIS — R6889 Other general symptoms and signs: Secondary | ICD-10-CM | POA: Diagnosis not present

## 2015-08-10 DIAGNOSIS — Z1159 Encounter for screening for other viral diseases: Secondary | ICD-10-CM | POA: Diagnosis not present

## 2015-08-10 LAB — GLUCOSE, CAPILLARY: Glucose-Capillary: 161 mg/dL — ABNORMAL HIGH (ref 65–99)

## 2015-08-10 MED ORDER — METFORMIN HCL 500 MG PO TABS: 500.0000 mg | ORAL_TABLET | Freq: Two times a day (BID) | ORAL | Status: DC

## 2015-08-10 MED ORDER — GLIPIZIDE 5 MG PO TABS: 5.0000 mg | ORAL_TABLET | Freq: Every day | ORAL | Status: DC

## 2015-08-10 NOTE — Progress Notes (Signed)
    Subjective    Clayton Kennedy is a 54 y.o. male that presents for a follow-up visit for chronic issues.   1. Diabetes: Patient has been adherent with metformin. He states that he has had diarrhea while using metformin 850mg  daily. He was not able to afford Januvia. He is still adamant about not starting insulin.  2. Concern for STI: patient reports no penile discharge. He is requesting an HIV. He would also like a syphilis test. No fevers, nausea, vomiting.  3. Hyperlipidemia: Patient not on medication. Last LDL of 115. He has not adjusted his diet much. He has tried to cut down in drinking.  Social History  Substance Use Topics  . Smoking status: Never Smoker   . Smokeless tobacco: None  . Alcohol Use: 25.2 oz/week    42 Cans of beer per week    No Known Allergies  No orders of the defined types were placed in this encounter.    ROS  Per HPI   Objective   Temp(Src) 97.8 F (36.6 C) (Oral)  Ht 6' (1.829 m)  Wt 195 lb (88.451 kg)  BMI 26.44 kg/m2  General: Well appearing, no distress  Assessment and Plan    No problem-specific assessment & plan notes found for this encounter.

## 2015-08-10 NOTE — Assessment & Plan Note (Signed)
Patient adherent with metformin, however, he is not tolerating it very well. Not able to afford Januvia. Will discontinue Januvia. Will decrease to metformin 500mg  BID. Will start glipizide 5mg  daily. Discussed hypoglycemia protocol. Will make sure patient gets a meter and is checking blood sugars daily. Follow-up in two months for repeat A1C.

## 2015-08-10 NOTE — Patient Instructions (Addendum)
Thank you for coming to see me today. It was a pleasure. Today we talked about:   Diabetes: I will decrease your metformin to 500mg  twice per day. I am also going to start glipizide 5mg  daily. We discussed low blood sugar  Labs: I will send you a letter in the mail with results  Please make an appointment to see me in 2 months for follow-up of diabetes.  If you have any questions or concerns, please do not hesitate to call the office at [phone].  Sincerely,  Jacquelin Hawkingalph Nettey, MD   Glipizide tablets What is this medicine? GLIPIZIDE (GLIP i zide) helps to treat type 2 diabetes. Treatment is combined with diet and exercise. The medicine helps your body to use insulin better. This medicine may be used for other purposes; ask your health care provider or pharmacist if you have questions. What should I tell my health care provider before I take this medicine? They need to know if you have any of these conditions: -diabetic ketoacidosis -glucose-6-phosphate dehydrogenase deficiency -heart disease -kidney disease -liver disease -porphyria -severe infection or injury -thyroid disease -an unusual or allergic reaction to glipizide, sulfa drugs, other medicines, foods, dyes, or preservatives -pregnant or trying to get pregnant -breast-feeding How should I use this medicine? Take this medicine by mouth. Swallow with a drink of water. Do not take with food. Take it 30 minutes before a meal. Follow the directions on the prescription label. If you take this medicine once a day, take it 30 minutes before breakfast. Take your doses at the same time each day. Do not take more often than directed. Talk to your pediatrician regarding the use of this medicine in children. Special care may be needed. Elderly patients over 297 years old may have a stronger reaction and need a smaller dose. Overdosage: If you think you have taken too much of this medicine contact a poison control center or emergency  room at once. NOTE: This medicine is only for you. Do not share this medicine with others. What if I miss a dose? If you miss a dose, take it as soon as you can. If it is almost time for your next dose, take only that dose. Do not take double or extra doses. What may interact with this medicine? -bosentan -chloramphenicol -cisapride -clarithromycin -medicines for fungal or yeast infections -metoclopramide -probenecid -warfarin Many medications may cause an increase or decrease in blood sugar, these include: -alcohol containing beverages -aspirin and aspirin-like drugs -chloramphenicol -chromium -diuretics -male hormones, like estrogens or progestins and birth control pills -heart medicines -isoniazid -male hormones or anabolic steroids -medicines for weight loss -medicines for allergies, asthma, cold, or cough -medicines for mental problems -medicines called MAO Inhibitors like Nardil, Parnate, Marplan, Eldepryl -niacin -NSAIDs, medicines for pain and inflammation, like ibuprofen or naproxen -pentamidine -phenytoin -probenecid -quinolone antibiotics like ciprofloxacin, levofloxacin, ofloxacin -some herbal dietary supplements -steroid medicines like prednisone or cortisone -thyroid medicine This list may not describe all possible interactions. Give your health care provider a list of all the medicines, herbs, non-prescription drugs, or dietary supplements you use. Also tell them if you smoke, drink alcohol, or use illegal drugs. Some items may interact with your medicine. What should I watch for while using this medicine? Visit your doctor or health care professional for regular checks on your progress. A test called the HbA1C (A1C) will be monitored. This is a simple blood test. It measures your blood sugar control over the last 2 to 3 months. You  will receive this test every 3 to 6 months. Learn how to check your blood sugar. Learn the symptoms of low and high blood sugar  and how to manage them. Always carry a quick-source of sugar with you in case you have symptoms of low blood sugar. Examples include hard sugar candy or glucose tablets. Make sure others know that you can choke if you eat or drink when you develop serious symptoms of low blood sugar, such as seizures or unconsciousness. They must get medical help at once. Tell your doctor or health care professional if you have high blood sugar. You might need to change the dose of your medicine. If you are sick or exercising more than usual, you might need to change the dose of your medicine. Do not skip meals. Ask your doctor or health care professional if you should avoid alcohol. Many nonprescription cough and cold products contain sugar or alcohol. These can affect blood sugar. This medicine can make you more sensitive to the sun. Keep out of the sun. If you cannot avoid being in the sun, wear protective clothing and use sunscreen. Do not use sun lamps or tanning beds/booths. Wear a medical ID bracelet or chain, and carry a card that describes your disease and details of your medicine and dosage times. What side effects may I notice from receiving this medicine? Side effects that you should report to your doctor or health care professional as soon as possible: -allergic reactions like skin rash, itching or hives, swelling of the face, lips, or tongue -breathing problems -dark urine -fever, chills, sore throat -signs and symptoms of low blood sugar such as feeling anxious, confusion, dizziness, increased hunger, unusually weak or tired, sweating, shakiness, cold, irritable, headache, blurred vision, fast heartbeat, loss of consciousness -unusual bleeding or bruising -yellowing of the eyes or skin Side effects that usually do not require medical attention (report to your doctor or health care professional if they continue or are bothersome): -diarrhea -dizziness -headache -heartburn -nausea -stomach gas This  list may not describe all possible side effects. Call your doctor for medical advice about side effects. You may report side effects to FDA at 1-800-FDA-1088. Where should I keep my medicine? Keep out of the reach of children. Store at room temperature below 30 degrees C (86 degrees F). Throw away any unused medicine after the expiration date. NOTE: This sheet is a summary. It may not cover all possible information. If you have questions about this medicine, talk to your doctor, pharmacist, or health care provider.    2016, Elsevier/Gold Standard. (2012-12-10 14:42:46)

## 2015-08-11 ENCOUNTER — Encounter: Payer: Self-pay | Admitting: Family Medicine

## 2015-08-11 LAB — HEPATITIS C ANTIBODY: HCV AB: NEGATIVE

## 2015-08-11 LAB — HIV ANTIBODY (ROUTINE TESTING W REFLEX): HIV 1&2 Ab, 4th Generation: NONREACTIVE

## 2015-08-11 LAB — RPR

## 2015-09-08 ENCOUNTER — Encounter: Payer: Self-pay | Admitting: Family Medicine

## 2015-09-08 ENCOUNTER — Ambulatory Visit (INDEPENDENT_AMBULATORY_CARE_PROVIDER_SITE_OTHER): Payer: Commercial Managed Care - HMO | Admitting: Family Medicine

## 2015-09-08 VITALS — BP 156/98 | HR 87 | Temp 98.2°F | Wt 200.5 lb

## 2015-09-08 DIAGNOSIS — R030 Elevated blood-pressure reading, without diagnosis of hypertension: Secondary | ICD-10-CM

## 2015-09-08 DIAGNOSIS — R6889 Other general symptoms and signs: Secondary | ICD-10-CM | POA: Diagnosis not present

## 2015-09-08 DIAGNOSIS — E1165 Type 2 diabetes mellitus with hyperglycemia: Secondary | ICD-10-CM | POA: Diagnosis not present

## 2015-09-08 DIAGNOSIS — F1010 Alcohol abuse, uncomplicated: Secondary | ICD-10-CM

## 2015-09-08 DIAGNOSIS — E119 Type 2 diabetes mellitus without complications: Secondary | ICD-10-CM | POA: Diagnosis not present

## 2015-09-08 DIAGNOSIS — IMO0001 Reserved for inherently not codable concepts without codable children: Secondary | ICD-10-CM

## 2015-09-08 DIAGNOSIS — F1020 Alcohol dependence, uncomplicated: Secondary | ICD-10-CM | POA: Insufficient documentation

## 2015-09-08 NOTE — Assessment & Plan Note (Signed)
Patient has cut down consumption by half. He will continue to work on this. Will continue to work with him in eventually discontinuing alcohol intake completely.

## 2015-09-08 NOTE — Assessment & Plan Note (Addendum)
CBGs slightly higher than I would like, but overall appears to be doing well. Will recheck A1C next month. Microalbumin/creatinine ratio

## 2015-09-08 NOTE — Patient Instructions (Signed)
Thank you for coming to see me today. It was a pleasure. Today we talked about:   Diabetes: I'm glad your blood sugars are looking pretty good. We will check your A1C next week.  Please make an appointment to see me in one month for follow-up of diabetes.  If you have any questions or concerns, please do not hesitate to call the office at [phone].  Sincerely,  Jacquelin Hawkingalph Tyffany Waldrop, MD

## 2015-09-08 NOTE — Progress Notes (Signed)
    Subjective    Clayton Kennedy is a 54 y.o. male that presents for a follow-up visit for chronic issues.   1. Diabetes: Patient adherent with metformin 500mg  BID and glipizide 5mg  daily. CBGs are generally in 130-150s. He has no polydipsia or polyuria.  2. Healthcare maintenance: patient declines influenza. Patient agrees to colonoscopy.   3. Alcohol dependence: He has been drinking about 3 12oz cans of beer per day, 21 per week. He reports continuing to decrease intake. Plan is to stop drinking.  4. Elevated blood pressure: no chest pain or dyspnea. Patient has cut down on drinking but states he has not been eating as well since the holidays just passed.  Social History  Substance Use Topics  . Smoking status: Never Smoker   . Smokeless tobacco: Not on file  . Alcohol Use: 25.2 oz/week    42 Cans of beer per week    No Known Allergies  No orders of the defined types were placed in this encounter.    ROS  Per HPI   Objective   BP 156/98 mmHg  Pulse 87  Temp(Src) 98.2 F (36.8 C) (Oral)  Wt 200 lb 8 oz (90.946 kg)  General: Well appearing, no distress Extremities: See foot exam  Assessment and Plan    Elevated blood pressure Uncontrolled today. Recheck not completed before patient left office. Will recheck at next visit.  Uncontrolled type 2 diabetes mellitus with hyperglycemia, without long-term current use of insulin (HCC) CBGs slightly higher than I would like, but overall appears to be doing well. Will recheck A1C next month.  Alcohol abuse Patient has cut down consumption by half. He will continue to work on this. Will continue to work with him in eventually discontinuing alcohol intake completely.

## 2015-09-08 NOTE — Assessment & Plan Note (Signed)
Uncontrolled today. Recheck not completed before patient left office. Will recheck at next visit.

## 2015-09-09 LAB — MICROALBUMIN / CREATININE URINE RATIO
Creatinine, Urine: 145 mg/dL (ref 20–370)
Microalb Creat Ratio: 17 mcg/mg creat (ref <30)
Microalb, Ur: 2.5 mg/dL

## 2015-09-10 ENCOUNTER — Encounter: Payer: Self-pay | Admitting: Family Medicine

## 2015-09-15 ENCOUNTER — Telehealth: Payer: Self-pay

## 2015-09-15 NOTE — Telephone Encounter (Signed)
Called pt to inform him that the insurance papers were faxed on 09/13/2015 @ 8:14 am. Sunday SpillersSharon T Marshal Eskew, CMA

## 2015-10-10 ENCOUNTER — Encounter: Payer: Self-pay | Admitting: Family Medicine

## 2015-10-10 ENCOUNTER — Ambulatory Visit: Payer: Commercial Managed Care - HMO | Admitting: Family Medicine

## 2015-10-10 ENCOUNTER — Ambulatory Visit (INDEPENDENT_AMBULATORY_CARE_PROVIDER_SITE_OTHER): Payer: Commercial Managed Care - HMO | Admitting: Family Medicine

## 2015-10-10 VITALS — BP 145/89 | HR 89 | Temp 98.1°F | Wt 205.2 lb

## 2015-10-10 DIAGNOSIS — Z1211 Encounter for screening for malignant neoplasm of colon: Secondary | ICD-10-CM

## 2015-10-10 DIAGNOSIS — E785 Hyperlipidemia, unspecified: Secondary | ICD-10-CM | POA: Diagnosis not present

## 2015-10-10 DIAGNOSIS — N529 Male erectile dysfunction, unspecified: Secondary | ICD-10-CM | POA: Diagnosis not present

## 2015-10-10 DIAGNOSIS — E1165 Type 2 diabetes mellitus with hyperglycemia: Secondary | ICD-10-CM

## 2015-10-10 DIAGNOSIS — R6889 Other general symptoms and signs: Secondary | ICD-10-CM | POA: Diagnosis not present

## 2015-10-10 LAB — POCT GLYCOSYLATED HEMOGLOBIN (HGB A1C): HEMOGLOBIN A1C: 7.5

## 2015-10-10 MED ORDER — ATORVASTATIN CALCIUM 40 MG PO TABS: 40.0000 mg | ORAL_TABLET | Freq: Every day | ORAL | Status: DC

## 2015-10-10 MED ORDER — SILDENAFIL CITRATE 20 MG PO TABS: 20.0000 mg | ORAL_TABLET | Freq: Every day | ORAL | Status: DC | PRN

## 2015-10-10 NOTE — Assessment & Plan Note (Signed)
A1C markedly improved. Will trial metformin  BID and continue glipizide  daily.

## 2015-10-10 NOTE — Patient Instructions (Addendum)
Thank you for coming to see me today. It was a pleasure. Today we talked about:   Diabetes: I'm glad your A1C is so much improved. I would like you to take 1 and a half pills of the metformin  twice per day.  High cholesterol: I will prescribe atorvastatin  ED: I will prescribe another prescription for sildenafil.  Please make an appointment to see me in 3 months.  If you have any questions or concerns, please do not hesitate to call the office at [phone].  Sincerely,  Jacquelin Hawking, MD  Sildenafil tablets (Viagra) What is this medicine? SILDENAFIL (sil DEN a fil) is used to treat erection problems in men. This medicine may be used for other purposes; ask your health care provider or pharmacist if you have questions. What should I tell my health care provider before I take this medicine? They need to know if you have any of these conditions: -bleeding disorders -eye or vision problems, including a rare inherited eye disease called retinitis pigmentosa -anatomical deformation of the penis, Peyronie's disease, or history of priapism (painful and prolonged erection) -heart disease, angina, a history of heart attack, irregular heart beats, or other heart problems -high or low blood pressure -history of blood diseases, like sickle cell anemia or leukemia -history of stomach bleeding -kidney disease -liver disease -stroke -an unusual or allergic reaction to sildenafil, other medicines, foods, dyes, or preservatives -pregnant or trying to get pregnant -breast-feeding How should I use this medicine? Take this medicine by mouth with a glass of water. Follow the directions on the prescription label. The dose is usually taken 1 hour before sexual activity. You should not take the dose more than once per day. Do not take your medicine more often than directed. Talk to your pediatrician regarding the use of this medicine in children. This medicine is not used in children for this  condition. Overdosage: If you think you have taken too much of this medicine contact a poison control center or emergency room at once. NOTE: This medicine is only for you. Do not share this medicine with others. What if I miss a dose? This does not apply. Do not take double or extra doses. What may interact with this medicine? Do not take this medicine with any of the following medications: -cisapride -methscopolamine nitrate -nitrates like amyl nitrite, isosorbide dinitrate, isosorbide mononitrate, nitroglycerin -nitroprusside -other medicines for erectile dysfunction like avanafil, tadalafil, vardenafil -riociguat -other sildenafil products (Revatio) This medicine may also interact with the following medications: -certain drugs for high blood pressure -certain drugs for the treatment of HIV infection or AIDS -certain drugs used for fungal or yeast infections, like fluconazole, itraconazole, ketoconazole, and voriconazole -cimetidine -erythromycin -rifampin This list may not describe all possible interactions. Give your health care provider a list of all the medicines, herbs, non-prescription drugs, or dietary supplements you use. Also tell them if you smoke, drink alcohol, or use illegal drugs. Some items may interact with your medicine. What should I watch for while using this medicine? If you notice any changes in your vision while taking this drug, call your doctor or health care professional as soon as possible. Stop using this medicine and call your health care provider right away if you have a loss of sight in one or both eyes. Contact your doctor or health care professional right away if you have an erection that lasts longer than 4 hours or if it becomes painful. This may be a sign of a serious  problem and must be treated right away to prevent permanent damage. If you experience symptoms of nausea, dizziness, chest pain or arm pain upon initiation of sexual activity after taking  this medicine, you should refrain from further activity and call your doctor or health care professional as soon as possible. Do not drink alcohol to excess (examples, 5 glasses of wine or 5 shots of whiskey) when taking this medicine. When taken in excess, alcohol can increase your chances of getting a headache or getting dizzy, increasing your heart rate or lowering your blood pressure. Using this medicine does not protect you or your partner against HIV infection (the virus that causes AIDS) or other sexually transmitted diseases. What side effects may I notice from receiving this medicine? Side effects that you should report to your doctor or health care professional as soon as possible: -allergic reactions like skin rash, itching or hives, swelling of the face, lips, or tongue -breathing problems -changes in hearing -changes in vision -chest pain -fast, irregular heartbeat -prolonged or painful erection -seizures Side effects that usually do not require medical attention (report to your doctor or health care professional if they continue or are bothersome): -back pain -dizziness -flushing -headache -indigestion -muscle aches -nausea -stuffy or runny nose This list may not describe all possible side effects. Call your doctor for medical advice about side effects. You may report side effects to FDA at 1-800-FDA-1088. Where should I keep my medicine? Keep out of reach of children. Store at room temperature between 15 and 30 degrees C (59 and 86 degrees F). Throw away any unused medicine after the expiration date. NOTE: This sheet is a summary. It may not cover all possible information. If you have questions about this medicine, talk to your doctor, pharmacist, or health care provider.    2016, Elsevier/Gold Standard. (2014-01-15 13:19:04)

## 2015-10-10 NOTE — Assessment & Plan Note (Signed)
Patient willing to try medication management. Refill sildenafil

## 2015-10-10 NOTE — Progress Notes (Signed)
    Subjective    Clayton Kennedy is a 55 y.o. male that presents for a follow-up visit for:   1. Diabetes mellitus: Patient adherent with Metformin  BID and glipizide  daily. No hypoglycemia. His fasting blood sugar usually ranges in 130 to 150.  2. Hyperlipidemia: Chronic issue. Currently not on statin therapy secondary to not wanting to start. No chest pain or dyspnea.  3. Erectile dysfunction: This is a chronic issue. Has been stable. Patient has not tried sildenafil yet.  Social History  Substance Use Topics  . Smoking status: Never Smoker   . Smokeless tobacco: None  . Alcohol Use: 25.2 oz/week    42 Cans of beer per week    No Known Allergies  No orders of the defined types were placed in this encounter.    ROS  Per HPI  Objective   BP 145/89 mmHg  Pulse 89  Temp(Src) 98.1 F (36.7 C) (Oral)  Wt 205 lb 3.2 oz (93.078 kg)  Vital signs reviewed  General: Well appearing, no distress  Assessment and Plan    Hyperlipidemia Will start atorvastatin  daily at this time.  Uncontrolled type 2 diabetes mellitus with hyperglycemia, without long-term current use of insulin (HCC) A1C markedly improved. Will trial metformin  BID and continue glipizide  daily.   Erectile dysfunction Patient willing to try medication management. Refill sildenafil

## 2015-10-10 NOTE — Assessment & Plan Note (Signed)
Will start atorvastatin  daily at this time.

## 2015-10-12 ENCOUNTER — Other Ambulatory Visit: Payer: Self-pay | Admitting: Family Medicine

## 2015-10-12 ENCOUNTER — Telehealth: Payer: Self-pay | Admitting: Family Medicine

## 2015-10-12 NOTE — Telephone Encounter (Signed)
LMOVM. Please advise pt that DM eye exam is scheduled for Wed. 10/19/15 at 9:00 AM with Dr. Fabian Sharp  [facility] Address: [address] [place], [place], [place] [address] Phone: [phone]  Arrive 15 mins early. Please call their office if this appt does not work.

## 2015-10-17 ENCOUNTER — Other Ambulatory Visit: Payer: Self-pay | Admitting: Family Medicine

## 2015-10-18 NOTE — Telephone Encounter (Signed)
Patient should not need a refill for this. Is this sent in error?

## 2015-12-19 ENCOUNTER — Ambulatory Visit (INDEPENDENT_AMBULATORY_CARE_PROVIDER_SITE_OTHER): Payer: Commercial Managed Care - HMO | Admitting: Family Medicine

## 2015-12-19 ENCOUNTER — Encounter: Payer: Self-pay | Admitting: Family Medicine

## 2015-12-19 ENCOUNTER — Ambulatory Visit (HOSPITAL_COMMUNITY)
Admission: RE | Admit: 2015-12-19 | Discharge: 2015-12-19 | Disposition: A | Discharge status: Home / Self Care | Payer: Commercial Managed Care - HMO | Source: Ambulatory Visit | Attending: Family Medicine | Admitting: Family Medicine

## 2015-12-19 VITALS — BP 134/77 | HR 90 | Temp 98.5°F | Ht 72.0 in | Wt 198.7 lb

## 2015-12-19 DIAGNOSIS — M79609 Pain in unspecified limb: Secondary | ICD-10-CM | POA: Diagnosis not present

## 2015-12-19 DIAGNOSIS — M79672 Pain in left foot: Secondary | ICD-10-CM | POA: Diagnosis not present

## 2015-12-19 DIAGNOSIS — E1165 Type 2 diabetes mellitus with hyperglycemia: Secondary | ICD-10-CM | POA: Diagnosis not present

## 2015-12-19 DIAGNOSIS — R0789 Other chest pain: Secondary | ICD-10-CM | POA: Diagnosis not present

## 2015-12-19 DIAGNOSIS — R6889 Other general symptoms and signs: Secondary | ICD-10-CM | POA: Diagnosis not present

## 2015-12-19 DIAGNOSIS — M79671 Pain in right foot: Secondary | ICD-10-CM

## 2015-12-19 DIAGNOSIS — M25532 Pain in left wrist: Secondary | ICD-10-CM

## 2015-12-19 DIAGNOSIS — B360 Pityriasis versicolor: Secondary | ICD-10-CM | POA: Diagnosis not present

## 2015-12-19 LAB — POCT GLYCOSYLATED HEMOGLOBIN (HGB A1C): Hemoglobin A1C: 7.1

## 2015-12-19 MED ORDER — KETOCONAZOLE 2 % EX SHAM: 1.0000 "application " | MEDICATED_SHAMPOO | Freq: Every day | CUTANEOUS | Status: DC

## 2015-12-19 MED ORDER — KETOCONAZOLE 2 % EX SHAM: 1.0000 "application " | MEDICATED_SHAMPOO | CUTANEOUS | Status: DC

## 2015-12-19 NOTE — Patient Instructions (Signed)
Thank you for coming to see me today. It was a pleasure. Today we talked about:   Rash: I have called in a prescription for Ketoconazole shampoo for three days.  Diabetes: your A1C was 7.1 today. Please continue your current medication regimen and continue working on losing weight.  Foot pain: I am checking a uric acid, although I do not think this is a gout flare.  Chest pain: This appears to be not related to the heart. It is possible that this is related to your reflux. I am getting a chest x-ray to make sure you do not have a pneumonia. If symptoms worsen, please call 911.  Please make an appointment to see me in 3 months for follow-up.  If you have any questions or concerns, please do not hesitate to call the office at [phone].  Sincerely,  Jacquelin Hawkingalph Raye Wiens, MD

## 2015-12-19 NOTE — Progress Notes (Signed)
    Subjective    Clayton Kennedy is a 55 y.o. male that presents for a follow-up visit for:   1. Chest pain: Symptoms first started three weeks ago. Symptoms are intermittent. He describes the pain as substernal, sharp pain. His pain has been worsening. He has some relief with lying back. Pain is non-exertional. He has associated diaphoresis, anxiety. No dyspnea. He has a history of GERD and tried Tums, which helped slightly. Pain worsen when lying on left side. No fevers, coughing. He does report some congestion.  2. Left wrist pain: Symptoms started after a garage door hit his hand as it fell closed. The incident happened two weeks ago. He has been taking ibuprofen 800mg  once per day which helps for the day.   3. Foot pain: Symptoms are chronic. He has increased numbness and tingling with burning generally located at metatarsophalangeal joint area. No symptoms on his plantar surface.  4. Rash: This is a chronic rash that has not improved. Rash is maculopapular and has spread from chest to neck. It is itchy.   Social History  Substance Use Topics  . Smoking status: Never Smoker   . Smokeless tobacco: None  . Alcohol Use: 25.2 oz/week    42 Cans of beer per week    No Known Allergies  No orders of the defined types were placed in this encounter.    ROS  Per HPI   Objective   BP 134/77 mmHg  Pulse 90  Temp(Src) 98.5 F (36.9 C) (Oral)  Ht 6' (1.829 m)  Wt 198 lb 11.2 oz (90.13 kg)  BMI 26.94 kg/m2  Vital signs reviewed  General: Well appearing, no distress Respiratory/Chest: Clear to auscultation bilaterally. Unlabored work of breathing. No wheezing or rales. Cardiovascular: Regular rate and rhythm. Normal S1 and S2. No heart murmurs present. No extra heart sounds Musculoskeletal: Left wrist with slight deformity compared to right. Tenderness over entire wrist with difficulty making out anatomy. Significantly limited flexion and extension of the wrist secondary to previous wrist  injury.  Foot with no swelling. Skin: Hyperpigmented macular rash extending from chest to neck  Assessment and Plan    Uncontrolled type 2 diabetes mellitus with hyperglycemia, without long-term current use of insulin (HCC) A1C improved to 7.1. Continue current regimen of glipizide 5mg  daily.  Tinea versicolor If no improvement, will refer to dermatology - ketoconazole (NIZORAL) 2 % shampoo; Apply 1 application topically daily. Use for the next three days.  Dispense: 100 mL; Refill: 0  Atypical chest pain EKG unremarkable for ST abnormalities. Chest pain is atypical. Do not suspect cardiac etiology. Will obtain a chest x-ray to confirm no pneumonia, although does not meet criteria clinically. Doubt pericarditis secondary to history and EKG results. - EKG 12-Lead - DG Chest 2 View; Future  4. Foot pain, bilateral Appears related to skin irritation. If worsens, can try topical cream to help with pain. Likely secondary to friction from shoes. Patient with a history of gout, however, no swelling noted. - Uric Acid  5. Left wrist pain Patient with history of trauma s/p surgery. Will obtain xray since patient has a recent trauma. - DG Wrist Complete Left; Future

## 2015-12-20 LAB — URIC ACID: Uric Acid, Serum: 7.9 mg/dL — ABNORMAL HIGH (ref 4.0–7.8)

## 2015-12-21 NOTE — Assessment & Plan Note (Addendum)
A1C improved to 7.1. Continue current regimen of glipizide 5mg  daily.

## 2015-12-23 ENCOUNTER — Ambulatory Visit
Admission: RE | Admit: 2015-12-23 | Discharge: 2015-12-23 | Disposition: A | Discharge status: Home / Self Care | Payer: Commercial Managed Care - HMO | Source: Ambulatory Visit | Attending: Family Medicine | Admitting: Family Medicine

## 2015-12-23 DIAGNOSIS — R0789 Other chest pain: Secondary | ICD-10-CM

## 2015-12-23 DIAGNOSIS — M25532 Pain in left wrist: Secondary | ICD-10-CM

## 2015-12-23 DIAGNOSIS — R6889 Other general symptoms and signs: Secondary | ICD-10-CM | POA: Diagnosis not present

## 2015-12-26 ENCOUNTER — Encounter: Payer: Self-pay | Admitting: Family Medicine

## 2015-12-26 ENCOUNTER — Telehealth: Payer: Self-pay | Admitting: Family Medicine

## 2015-12-26 DIAGNOSIS — E790 Hyperuricemia without signs of inflammatory arthritis and tophaceous disease: Secondary | ICD-10-CM

## 2015-12-26 DIAGNOSIS — E785 Hyperlipidemia, unspecified: Secondary | ICD-10-CM

## 2015-12-26 MED ORDER — ATORVASTATIN CALCIUM 40 MG PO TABS: 20.0000 mg | ORAL_TABLET | Freq: Every day | ORAL | Status: DC

## 2015-12-26 MED ORDER — COLCHICINE 0.6 MG PO TABS: 0.6000 mg | ORAL_TABLET | Freq: Every day | ORAL | Status: DC

## 2015-12-26 NOTE — Telephone Encounter (Signed)
Physician Telephone Note  Reason for call: Results   Discussion with patient: Patient not available and could not leave a voice message. Uric acid slightly elevated. With patient's history of gout, will call in colchicine 0.6mg  daily (take two pills today). Two refills placed. Please inform patient to take his Lipitor 40mg  as a half tablet daily since we are starting this new medication. If patient asks about x-rays, okay to inform that there were no fractures, however, I have sent a letter to him.  Jacquelin Hawkingalph Hafsa Lohn, MD PGY-3, [facility] 12/26/2015, 3:56 PM

## 2016-01-02 ENCOUNTER — Telehealth: Payer: Self-pay | Admitting: *Deleted

## 2016-01-02 NOTE — Telephone Encounter (Signed)
PT informed. Zimmerman Rumple, Faith Patricelli D, CMA  

## 2016-01-02 NOTE — Telephone Encounter (Signed)
Patient states that [facility] never received refill on colchicine, would like this resent to [facility] or can be faxed to them at [phone]

## 2016-01-03 NOTE — Telephone Encounter (Signed)
Pt informed. Henretta Quist T Myleah Cavendish, CMA  

## 2016-01-03 NOTE — Telephone Encounter (Signed)
Please call patient to let him know that I canceled the prescription because it had been greater than 2 days since symptoms. If still having pain, can use ibuprofen OTC

## 2016-04-16 ENCOUNTER — Encounter: Payer: Self-pay | Admitting: Family Medicine

## 2016-04-16 ENCOUNTER — Ambulatory Visit: Payer: Commercial Managed Care - HMO | Admitting: Family Medicine

## 2016-04-16 ENCOUNTER — Ambulatory Visit (INDEPENDENT_AMBULATORY_CARE_PROVIDER_SITE_OTHER): Payer: Commercial Managed Care - HMO | Admitting: Family Medicine

## 2016-04-16 VITALS — BP 135/95 | HR 85 | Temp 97.5°F | Ht 72.0 in | Wt 194.4 lb

## 2016-04-16 DIAGNOSIS — G5793 Unspecified mononeuropathy of bilateral lower limbs: Secondary | ICD-10-CM

## 2016-04-16 DIAGNOSIS — E1165 Type 2 diabetes mellitus with hyperglycemia: Secondary | ICD-10-CM | POA: Diagnosis not present

## 2016-04-16 DIAGNOSIS — F1010 Alcohol abuse, uncomplicated: Secondary | ICD-10-CM

## 2016-04-16 DIAGNOSIS — F1210 Cannabis abuse, uncomplicated: Secondary | ICD-10-CM

## 2016-04-16 DIAGNOSIS — R0789 Other chest pain: Secondary | ICD-10-CM

## 2016-04-16 DIAGNOSIS — R071 Chest pain on breathing: Secondary | ICD-10-CM

## 2016-04-16 DIAGNOSIS — F1290 Cannabis use, unspecified, uncomplicated: Secondary | ICD-10-CM

## 2016-04-16 DIAGNOSIS — R6889 Other general symptoms and signs: Secondary | ICD-10-CM | POA: Diagnosis not present

## 2016-04-16 LAB — MICROALBUMIN, URINE: Microalb, Ur: 5 mg/dL

## 2016-04-16 LAB — POCT GLYCOSYLATED HEMOGLOBIN (HGB A1C): Hemoglobin A1C: 7.1

## 2016-04-16 MED ORDER — GABAPENTIN 300 MG PO CAPS: 300.0000 mg | ORAL_CAPSULE | Freq: Every day | ORAL | 3 refills | Status: DC

## 2016-04-16 NOTE — Progress Notes (Signed)
Subjective: CC: DM HPI: Clayton Kennedy is a 55 y.o. male presenting to clinic today for follow up. Concerns today include:  1. Diabetes:  High at home: 170 Low at home: 130 Taking medications: Metformin 500mg  BID, glipizide, lipitor 40mg  ROS: denies fever, chills, dizziness, LOC, polydipsia, or chest pain. Endorses burning/ numbness/ tingling in extremities/ polyuria Last eye exam: was referred but cannot afford Last foot exam: >1 year Last A1c: 7.1  Nephropathy screen indicated?: UMicro/Cr: 17 08/2015 Last flu, zoster and/or pneumovax: PNA, Tdap (patient refused)  2. Stomach pain Notes that he started about 2-3 weeks ago.  He reports that he is a smoker of MJ.  He reports that he drinks a 6ppd of beer.  He notes that he has a family history of esophageal cancer in his mother and sister.  He notes his maternal uncle had throat cancer.  Denies melena, hematochezia, nausea, vomiting, weight loss, chills.  Social History Reviewed: marijuana smoker 3x/day. FamHx and MedHx reviewed.  Please see EMR. Health Maintenance: colonoscopy, DM eye exam  ROS: Per HPI  Objective: Office vital signs reviewed. BP (!) 135/95 (BP Location: Right Arm, Patient Position: Sitting, Cuff Size: Normal)   Pulse 85   Temp 97.5 F (36.4 C) (Oral)   Ht 6' (1.829 m)   Wt 194 lb 6.4 oz (88.2 kg)   BMI 26.37 kg/m   Physical Examination:  General: Awake, alert, well nourished, No acute distress HEENT: Normal, sclera white GI: soft, non-tender (specifically no epigastric TTP), non-distended, bowel sounds present x4, no masses Extremities: cool, No edema, cyanosis or clubbing; +1 DP bilaterally MSK: Normal gait and station; +mild TTP at xyphoid process Skin: dry, intact, no rashes or lesions Neuro: Strength and sensation grossly intact, monofilament testing below  Diabetic Foot Form - Detailed   Diabetic Foot Exam - detailed Diabetic Foot exam was performed with the following findings:  Yes 04/16/2016  10:57 AM  Visual Foot Exam completed.:  Yes  Is there a history of foot ulcer?:  No Can the patient see the bottom of their feet?:  Yes Are the shoes appropriate in style and fit?:  Yes Is there swelling or and abnormal foot shape?:  No Are the toenails long?:  No Are the toenails thick?:  No Do you have pain in calf while walking?:  No Is there a claw toe deformity?:  No Is there elevated skin temparature?:  No Is there limited skin dorsiflexion?:  No Is there foot or ankle muscle weakness?:  No Are the toenails ingrown?:  No Normal Range of Motion:  Yes Pulse Foot Exam completed.:  Yes  Right posterior Tibialias:  Diminished Left posterior Tibialias:  Diminished  Right Dorsalis Pedis:  Diminished Left Dorsalis Pedis:  Diminished  Sensory Foot Exam Completed.:  Yes Swelling:  No Semmes-Weinstein Monofilament Test R Foot Test Control:  Pos L Foot Test Control:  Pos  R Site 1-Great Toe:  Pos L Site 1-Great Toe:  Pos  R Site 4:  Pos L Site 4:  Pos  R Site 5:  Pos L Site 5:  Pos    Comments:  Normal monofilament     Assessment/ Plan: 55 y.o. male   1. Uncontrolled type 2 diabetes mellitus with hyperglycemia, without long-term current use of insulin (HCC).  A1c stable from previous 7.1. - Discussed that ETOH cessation will be a key component in decreasing A1c. - DM foot exam performed and WNL - Referral for DM eye exam placed 09/2015 but patient  has not scheduled due to financial constraints.  He thinks he will be able to afford now - If still not scheduled by annual exam, will plan to give hemmocult for home - HgB A1c - Microalbumin, urine - gabapentin (NEURONTIN) 300 MG capsule; Take 1 capsule (300 mg total) by mouth at bedtime.  Dispense: 90 capsule; Refill: 3 - If continues to be >7 at next visit, will plan to increase Glucotrol to  - Declines PNA, TDap  2. Alcohol abuse, precontemplative - Recommended cessation - Recommended MVI, folate and thiamine, patient  resistant to taking additional medications at this time  3. Costochondral pain.  Prominent xyphoid process but no masses appreciated - Recommended Motrin vs Tylenol to help with pain  4. Neuropathy involving both lower extremities (HCC), likely multifactorial given DM2 and ETOH abuse. - Discussed ETOH cessation - Need to control DM2 - Neurontin  qhs.  5. Marijuana use, precontemplative.  3 times daily every day user. - Discussed cessation   Follow up in November for annual exam with fasting labs.  Need to address BP at next visit, 135/95 today.  Also need to continue to revisit ETOH cessation.  Raliegh Ip, DO PGY-3, [facility]

## 2016-04-16 NOTE — Patient Instructions (Addendum)
Plan to schedule an appointment with me in November for your annual exam with fasting labs.  Please make sure to not eat anything for 8 hours before having your labs done.  I recommend getting your colonoscopy and diabetic eye exam.  When you are ready to cut back on alcohol, let me know.  I am happy to help you with this.  Neuropathic Pain Neuropathic pain is pain caused by damage to the nerves that are responsible for certain sensations in your body (sensory nerves). The pain can be caused by damage to:   The sensory nerves that send signals to your spinal cord and brain (peripheral nervous system).  The sensory nerves in your brain or spinal cord (central nervous system). Neuropathic pain can make you more sensitive to pain. What would be a minor sensation for most people may feel very painful if you have neuropathic pain. This is usually a long-term condition that can be difficult to treat. The type of pain can differ from person to person. It may start suddenly (acute), or it may develop slowly and last for a long time (chronic). Neuropathic pain may come and go as damaged nerves heal or may stay at the same level for years. It often causes emotional distress, loss of sleep, and a lower quality of life. CAUSES  The most common cause of damage to a sensory nerve is diabetes. Many other diseases and conditions can also cause neuropathic pain. Causes of neuropathic pain can be classified as:  Toxic. Many drugs and chemicals can cause toxic damage. The most common cause of toxic neuropathic pain is damage from drug treatment for cancer (chemotherapy).  Metabolic. This type of pain can happen when a disease causes imbalances that damage nerves. Diabetes is the most common of these diseases. Vitamin B deficiency caused by long-term alcohol abuse is another common cause.  Traumatic. Any injury that cuts, crushes, or stretches a nerve can cause damage and pain. A common example is feeling pain after  losing an arm or leg (phantom limb pain).  Compression-related. If a sensory nerve gets trapped or compressed for a long period of time, the blood supply to the nerve can be cut off.  Vascular. Many blood vessel diseases can cause neuropathic pain by decreasing blood supply and oxygen to nerves.  Autoimmune. This type of pain results from diseases in which the body's defense system mistakenly attacks sensory nerves. Examples of autoimmune diseases that can cause neuropathic pain include lupus and multiple sclerosis.  Infectious. Many types of viral infections can damage sensory nerves and cause pain. Shingles infection is a common cause of this type of pain.  Inherited. Neuropathic pain can be a symptom of many diseases that are passed down through families (genetic). SIGNS AND SYMPTOMS  The main symptom is pain. Neuropathic pain is often described as:  Burning.  Shock-like.  Stinging.  Hot or cold.  Itching. DIAGNOSIS  No single test can diagnose neuropathic pain. Your health care provider will do a physical exam and ask you about your pain. You may use a pain scale to describe how bad your pain is. You may also have tests to see if you have a high sensitivity to pain and to help find the cause and location of any sensory nerve damage. These tests may include:  Imaging studies, such as:  X-rays.  CT scan.  MRI.  Nerve conduction studies to test how well nerve signals travel through your sensory nerves (electrodiagnostic testing).  Stimulating your sensory  nerves through electrodes on your skin and measuring the response in your spinal cord and brain (somatosensory evoked potentials). TREATMENT  Treatment for neuropathic pain may change over time. You may need to try different treatment options or a combination of treatments. Some options include:  Over-the-counter pain relievers.  Prescription medicines. Some medicines used to treat other conditions may also help  neuropathic pain. These include medicines to:  Control seizures (anticonvulsants).  Relieve depression (antidepressants).  Prescription-strength pain relievers (narcotics). These are usually used when other pain relievers do not help.  Transcutaneous nerve stimulation (TENS). This uses electrical currents to block painful nerve signals. The treatment is painless.  Topical and local anesthetics. These are medicines that numb the nerves. They can be injected as a nerve block or applied to the skin.  Alternative treatments, such as:  Acupuncture.  Meditation.  Massage.  Physical therapy.  Pain management programs.  Counseling. HOME CARE INSTRUCTIONS  Learn as much as you can about your condition.  Take medicines only as directed by your health care provider.  Work closely with all your health care providers to find what works best for you.  Have a good support system at home.  Consider joining a chronic pain support group. SEEK MEDICAL CARE IF:  Your pain treatments are not helping.  You are having side effects from your medicines.  You are struggling with fatigue, mood changes, depression, or anxiety.   This information is not intended to replace advice given to you by your health care provider. Make sure you discuss any questions you have with your health care provider.   Document Released: 05/24/2004 Document Revised: 09/17/2014 Document Reviewed: 02/04/2014 Elsevier Interactive Patient Education [employer]

## 2016-04-17 ENCOUNTER — Encounter: Payer: Self-pay | Admitting: Family Medicine

## 2016-07-20 ENCOUNTER — Ambulatory Visit (INDEPENDENT_AMBULATORY_CARE_PROVIDER_SITE_OTHER): Payer: Commercial Managed Care - HMO | Admitting: Family Medicine

## 2016-07-20 ENCOUNTER — Encounter: Payer: Self-pay | Admitting: Family Medicine

## 2016-07-20 VITALS — BP 160/102 | HR 87 | Temp 98.6°F | Ht 72.0 in | Wt 199.0 lb

## 2016-07-20 DIAGNOSIS — J988 Other specified respiratory disorders: Secondary | ICD-10-CM | POA: Diagnosis not present

## 2016-07-20 DIAGNOSIS — Z0000 Encounter for general adult medical examination without abnormal findings: Secondary | ICD-10-CM | POA: Diagnosis not present

## 2016-07-20 DIAGNOSIS — R6889 Other general symptoms and signs: Secondary | ICD-10-CM | POA: Diagnosis not present

## 2016-07-20 DIAGNOSIS — E119 Type 2 diabetes mellitus without complications: Secondary | ICD-10-CM | POA: Diagnosis not present

## 2016-07-20 DIAGNOSIS — R946 Abnormal results of thyroid function studies: Secondary | ICD-10-CM

## 2016-07-20 DIAGNOSIS — F1290 Cannabis use, unspecified, uncomplicated: Secondary | ICD-10-CM | POA: Diagnosis not present

## 2016-07-20 DIAGNOSIS — F1020 Alcohol dependence, uncomplicated: Secondary | ICD-10-CM | POA: Diagnosis not present

## 2016-07-20 DIAGNOSIS — R7989 Other specified abnormal findings of blood chemistry: Secondary | ICD-10-CM

## 2016-07-20 DIAGNOSIS — Z1211 Encounter for screening for malignant neoplasm of colon: Secondary | ICD-10-CM

## 2016-07-20 LAB — COMPLETE METABOLIC PANEL WITH GFR
ALBUMIN: 4.9 g/dL (ref 3.6–5.1)
ALK PHOS: 55 U/L (ref 40–115)
ALT: 23 U/L (ref 9–46)
AST: 18 U/L (ref 10–35)
BUN: 8 mg/dL (ref 7–25)
CALCIUM: 10.2 mg/dL (ref 8.6–10.3)
CHLORIDE: 101 mmol/L (ref 98–110)
CO2: 26 mmol/L (ref 20–31)
CREATININE: 1.1 mg/dL (ref 0.70–1.33)
GFR, Est African American: 89 mL/min (ref >=60)
GFR, Est Non African American: 77 mL/min (ref >=60)
GLUCOSE: 100 mg/dL — AB (ref 65–99)
Potassium: 4.3 mmol/L (ref 3.5–5.3)
SODIUM: 138 mmol/L (ref 135–146)
Total Bilirubin: 0.5 mg/dL (ref 0.2–1.2)
Total Protein: 7.6 g/dL (ref 6.1–8.1)

## 2016-07-20 LAB — LIPID PANEL
CHOLESTEROL: 173 mg/dL (ref <200)
HDL: 35 mg/dL — ABNORMAL LOW (ref >40)
LDL Cholesterol: 64 mg/dL (ref <100)
Total CHOL/HDL Ratio: 4.9 Ratio (ref <5.0)
Triglycerides: 372 mg/dL — ABNORMAL HIGH (ref <150)
VLDL: 74 mg/dL — AB (ref <30)

## 2016-07-20 LAB — TSH: TSH: 2.67 m[IU]/L (ref 0.40–4.50)

## 2016-07-20 LAB — POCT GLYCOSYLATED HEMOGLOBIN (HGB A1C): Hemoglobin A1C: 6.7

## 2016-07-20 MED ORDER — METFORMIN HCL 500 MG PO TABS: 500.0000 mg | ORAL_TABLET | Freq: Two times a day (BID) | ORAL | 4 refills | Status: DC

## 2016-07-20 MED ORDER — GABAPENTIN 300 MG PO CAPS: 300.0000 mg | ORAL_CAPSULE | Freq: Two times a day (BID) | ORAL | 3 refills | Status: DC

## 2016-07-20 MED ORDER — AZITHROMYCIN 250 MG PO TABS: ORAL_TABLET | ORAL | 0 refills | Status: DC

## 2016-07-20 MED ORDER — GLIPIZIDE 5 MG PO TABS: 5.0000 mg | ORAL_TABLET | Freq: Every day | ORAL | 4 refills | Status: DC

## 2016-07-20 NOTE — Patient Instructions (Signed)

## 2016-07-20 NOTE — Progress Notes (Signed)
Clayton Kennedy is a 55 y.o. male presents to office today for annual physical exam examination.  Concerns today include:  Cough Patient reports productive cough x1 week.  He notes brown sputum.  He reports that he is a daily smoker of marijuana.  Denies fevers, chills.  Never a smoker of cigarettes.  Denies fatigue.  He reports his wife was recently diagnosed and treated for pneumonia.  He reports he tried to stay active.  He notes he walks regularly at his apt complex gym.  Last eye exam: >1 year Last dental exam: >1 year Last colonoscopy: never  Past Medical History:  Diagnosis Date  . Diverticulitis   . Gout    Social History   Social History  . Marital status: Single    Spouse name: N/A  . Number of children: N/A  . Years of education: N/A   Occupational History  . Not on file.   Social History Main Topics  . Smoking status: Never Smoker  . Smokeless tobacco: Never Used  . Alcohol use 25.2 oz/week    42 Cans of beer per week  . Drug use:     Frequency: 21.0 times per week    Types: Marijuana     Comment: 3 per day  . Sexual activity: Yes    Partners: Female   Other Topics Concern  . Not on file   Social History Narrative  . No narrative on file   Past Surgical History:  Procedure Laterality Date  . COLON SURGERY     Partial colectomy for diverticulitis  . ORIF ULNAR FRACTURE     Family History  Problem Relation Age of Onset  . Cancer Mother     Esophageal  . Alcohol abuse Father   . Diabetes Father   . Cancer Sister     uterine, esophageal  . Stroke Maternal Aunt   . Cancer Maternal Uncle     throat    ROS: Review of Systems Constitutional: negative Eyes: negative Ears, nose, mouth, throat, and face: negative Respiratory: positive for cough and sputum Cardiovascular: negative Gastrointestinal: positive for occ abdominal pain Genitourinary:negative Integument/breast: negative Hematologic/lymphatic:  negative Musculoskeletal:negative Neurological: negative Behavioral/Psych: positive for marijuana and ETOH use Endocrine: negative Allergic/Immunologic: negative    Physical exam BP (!) 160/102   Pulse 87   Temp 98.6 F (37 C) (Oral)   Ht 6' (1.829 m)   Wt 199 lb (90.3 kg)   SpO2 98%   BMI 26.99 kg/m  General appearance: alert, cooperative, appears stated age and no distress Head: Normocephalic, without obvious abnormality, atraumatic Eyes: negative findings: lids and lashes normal, conjunctivae and sclerae normal and corneas clear Ears: normal External ears Nose: Nares normal. Septum midline. Mucosa normal. No drainage or sinus tenderness. Throat: lips, mucosa, and tongue normal; teeth and gums normal Neck: no adenopathy, supple, symmetrical, trachea midline and thyroid not enlarged, symmetric, no tenderness/mass/nodules Back: symmetric, no curvature. ROM normal. No CVA tenderness. Lungs: slight decreased air movement in right lung Chest wall: no tenderness Heart: regular rate and rhythm, S1, S2 normal, no murmur, click, rub or gallop Abdomen: soft, non-tender; bowel sounds normal; no masses,  no organomegaly Extremities: extremities normal, atraumatic, no cyanosis or edema Pulses: 2+ and symmetric Skin: Skin color, texture, turgor normal. No rashes or lesions Lymph nodes: Cervical, supraclavicular, and axillary nodes normal. Neurologic: Alert and oriented X 3, normal strength and tone. Normal symmetric reflexes. Normal coordination and gait   Results for orders placed or performed in visit  on 07/20/16 (from the past 24 hour(s))  POCT glycosylated hemoglobin (Hb A1C)     Status: None   Collection Time: 07/20/16  2:48 PM  Result Value Ref Range   Hemoglobin A1C 6.7    Assessment/ Plan: Clayton ModenaPaul Seltzer here for annual physical exam.   1. Annual physical exam - no changes to history - ETOH and MJ cessation - Labs ordered.  2. Controlled type 2 diabetes mellitus without  complication, without long-term current use of insulin (HCC). Doing better.  A1c down from previous. - POCT glycosylated hemoglobin (Hb A1C) - COMPLETE METABOLIC PANEL WITH GFR - Lipid panel - gabapentin (NEURONTIN) 300 MG capsule; Take 1 capsule (300 mg total) by mouth 2 (two) times daily.  Dispense: 180 capsule; Refill: 3 - glipiZIDE (GLUCOTROL) 5 MG tablet; Take 1 tablet (5 mg total) by mouth daily before breakfast.  Dispense: 90 tablet; Refill: 4 - metFORMIN (GLUCOPHAGE) 500 MG tablet; Take 1 tablet (500 mg total) by mouth 2 (two) times daily with a meal.  Dispense: 180 tablet; Refill: 4  3. Marijuana use - recommend cessation - precontemplative  4. Low TSH level - TSH  5. Screening for colon cancer - Ambulatory referral to Gastroenterology  6. Respiratory infection - Return precautions reviewed - azithromycin (ZITHROMAX) 250 MG tablet; Take 500mg  day one, then take 250mg  daily x4 days  Dispense: 6 each; Refill: 0 - CBC  7. Alcoholism (HCC) - pre contemplative - Again stressed importance of Folic acid, thiamine and daily vitamin d - CBC  Vangie Henthorn M. Nadine CountsGottschalk, DO PGY-3, [facility]

## 2016-07-21 LAB — CBC
HCT: 50.5 % — ABNORMAL HIGH (ref 38.5–50.0)
Hemoglobin: 17.6 g/dL — ABNORMAL HIGH (ref 13.2–17.1)
MCH: 30.9 pg (ref 27.0–33.0)
MCHC: 34.9 g/dL (ref 32.0–36.0)
MCV: 88.6 fL (ref 80.0–100.0)
MPV: 10.7 fL (ref 7.5–12.5)
Platelets: 296 10*3/uL (ref 140–400)
RBC: 5.7 MIL/uL (ref 4.20–5.80)
RDW: 13.5 % (ref 11.0–15.0)
WBC: 10.2 10*3/uL (ref 3.8–10.8)

## 2016-07-23 ENCOUNTER — Telehealth: Payer: Self-pay | Admitting: Family Medicine

## 2016-07-23 NOTE — Telephone Encounter (Signed)
No answer. Left message about free diabetic eye exam 08/17/16 and asked to call back.

## 2016-07-25 ENCOUNTER — Encounter: Payer: Self-pay | Admitting: Family Medicine

## 2016-08-22 ENCOUNTER — Encounter: Payer: Self-pay | Admitting: Family Medicine

## 2017-01-29 DIAGNOSIS — R6889 Other general symptoms and signs: Secondary | ICD-10-CM | POA: Diagnosis not present

## 2017-01-29 DIAGNOSIS — H524 Presbyopia: Secondary | ICD-10-CM | POA: Diagnosis not present

## 2017-07-29 ENCOUNTER — Ambulatory Visit (INDEPENDENT_AMBULATORY_CARE_PROVIDER_SITE_OTHER): Payer: Medicare HMO | Admitting: Family Medicine

## 2017-07-29 ENCOUNTER — Encounter: Payer: Self-pay | Admitting: Family Medicine

## 2017-07-29 VITALS — BP 148/98 | HR 91 | Temp 98.4°F | Ht 72.0 in | Wt 196.2 lb

## 2017-07-29 DIAGNOSIS — E782 Mixed hyperlipidemia: Secondary | ICD-10-CM | POA: Diagnosis not present

## 2017-07-29 DIAGNOSIS — G609 Hereditary and idiopathic neuropathy, unspecified: Secondary | ICD-10-CM | POA: Diagnosis not present

## 2017-07-29 DIAGNOSIS — E119 Type 2 diabetes mellitus without complications: Secondary | ICD-10-CM | POA: Diagnosis not present

## 2017-07-29 DIAGNOSIS — I10 Essential (primary) hypertension: Secondary | ICD-10-CM

## 2017-07-29 DIAGNOSIS — R6889 Other general symptoms and signs: Secondary | ICD-10-CM | POA: Diagnosis not present

## 2017-07-29 DIAGNOSIS — F1010 Alcohol abuse, uncomplicated: Secondary | ICD-10-CM

## 2017-07-29 LAB — POCT GLYCOSYLATED HEMOGLOBIN (HGB A1C): Hemoglobin A1C: 6.3

## 2017-07-29 MED ORDER — GLIPIZIDE 5 MG PO TABS: 5.0000 mg | ORAL_TABLET | Freq: Every day | ORAL | 4 refills | Status: DC

## 2017-07-29 MED ORDER — METFORMIN HCL 500 MG PO TABS: 500.0000 mg | ORAL_TABLET | Freq: Two times a day (BID) | ORAL | 4 refills | Status: AC

## 2017-07-29 MED ORDER — GABAPENTIN 300 MG PO CAPS: 300.0000 mg | ORAL_CAPSULE | Freq: Two times a day (BID) | ORAL | 3 refills | Status: DC

## 2017-07-29 NOTE — Assessment & Plan Note (Signed)
Recheck lipids today. Likely needs a statin, will call with this once results are back.

## 2017-07-29 NOTE — Progress Notes (Signed)
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 Drinks "a lot of beer." 1 6pack per day at least. Doesn't miss any days. Might be a case of beer on a Friday. Been drinking like this for 25 years. Has never detoxed, never seizures 2/2 withdrawal. Smokes pot daily. Does not desire to change either of these habits. Doesn't drive as he never got a license after living in [place].   DM - HgbA1c 6.3 today. States he hasn't been "behaving" with his diet, although does endorse eating less carbs. neuropathy - worse at night. Hasn't been taking the gabapentin daily. When he does take it it helps. Doesn't check CBGs at home.   HTN - no meds currently.   HLD - hx of lipitor, not currently taking.   Did not pick up the sildenafil as it was too expensive. Gets blue pills on the side.   ROS: denies CP, SOB, abd pain, dysuria, changes in BMs.   CC, SH/smoking status, and VS noted  Objective: BP (!) 148/98   Pulse 91   Temp 98.4 F (36.9 C) (Oral)   Ht 6' (1.829 m)   Wt 196 lb 3.2 oz (89 kg)   SpO2 97%   BMI 26.61 kg/m  Gen: NAD, alert, cooperative, and pleasant. HEENT: NCAT, EOMI, PERRL CV: RRR, no murmur Resp: CTAB, no wheezes, non-labored Ext: No edema, warm Neuro: Alert and oriented, Speech clear, No gross deficits Diabetic Foot Check -  Appearance - no lesions, ulcers or calluses Skin - no unusual pallor or redness Monofilament testing -  Right - Great toe, medial, central, lateral ball and posterior foot intact Left - Great toe, medial, central, lateral ball and posterior foot intact   Assessment and plan:  Type 2 diabetes mellitus, controlled (HCC) Continue metformin and glipizide. Recheck a1c in 6 months. Foot exam updated today. [place]   Hyperlipidemia Recheck lipids today. Likely needs a statin, will call with this once results are back.   Essential hypertension BP elevated on manual recheck  today. Given DM, would likely benefit from arb. Will consider losartan pending BMP results. Patient to follow up to recheck BP in 1 month (and labs prior to this to recheck Cr if starting arb).   Alcohol abuse Precontemplative. Instructed patient to take a multivitamin as thiamine and b12 deficiency could be contributing to his peripheral neuropathy. Will check vitamins if CBC abnormal.    Orders Placed This Encounter  Procedures  . Microalbumin/Creatinine Ratio, Urine  . CBC  . CMP14+EGFR  . Lipid panel  . HgB A1c    Meds ordered this encounter  Medications  . gabapentin (NEURONTIN) 300 MG capsule    Sig: Take 1 capsule (300 mg total) 2 (two) times daily by mouth.    Dispense:  180 capsule    Refill:  3  . glipiZIDE (GLUCOTROL) 5 MG tablet    Sig: Take 1 tablet (5 mg total) daily before breakfast by mouth.    Dispense:  90 tablet    Refill:  4  . metFORMIN (GLUCOPHAGE) 500 MG tablet    Sig: Take 1 tablet (500 mg total) 2 (two) times daily with a meal by mouth.    Dispense:  180 tablet    Refill:  4    Health Maintenance reviewed - patient asked to schedule diabetic eye exam, urinary microalbumin ordered, patient refuses all vaccinations.  Ralene Ok, MD, PGY2 07/29/2017 4:07 PM

## 2017-07-29 NOTE — Assessment & Plan Note (Signed)
Precontemplative. Instructed patient to take a multivitamin as thiamine and b12 deficiency could be contributing to his peripheral neuropathy. Will check vitamins if CBC abnormal.

## 2017-07-29 NOTE — Patient Instructions (Addendum)
It was a pleasure to see you today! Thank you for choosing [facility] for your primary care. Clayton Kennedy was seen for BP, DM.   Our plans for today were:  I will call you with your lab results and send you a blood pressure medicine based on that.   Take a multivitamin for your neuropathy as well as continuing the gabapentin.   To keep you healthy, we need to monitor some screening tests. You are due for your colonoscopy. Please schedule this.   You should return to our clinic to see Dr. Chanetta Marshallimberlake in 2 months for blood pressure.   Best,  Dr. Chanetta Marshallimberlake

## 2017-07-29 NOTE — Assessment & Plan Note (Signed)
Continue metformin and glipizide. Recheck a1c in 6 months. Foot exam updated today. Patient [place]

## 2017-07-29 NOTE — Assessment & Plan Note (Signed)
BP elevated on manual recheck today. Given DM, would likely benefit from arb. Will consider losartan pending BMP results. Patient to follow up to recheck BP in 1 month (and labs prior to this to recheck Cr if starting arb).

## 2017-07-30 LAB — CMP14+EGFR
ALBUMIN: 4.8 g/dL (ref 3.5–5.5)
ALK PHOS: 60 IU/L (ref 39–117)
ALT: 25 IU/L (ref 0–44)
AST: 20 IU/L (ref 0–40)
Albumin/Globulin Ratio: 2 (ref 1.2–2.2)
BUN/Creatinine Ratio: 11 (ref 9–20)
BUN: 9 mg/dL (ref 6–24)
Bilirubin Total: 0.4 mg/dL (ref 0.0–1.2)
CALCIUM: 9.8 mg/dL (ref 8.7–10.2)
CO2: 22 mmol/L (ref 20–29)
CREATININE: 0.84 mg/dL (ref 0.76–1.27)
Chloride: 99 mmol/L (ref 96–106)
GFR calc Af Amer: 116 mL/min/{1.73_m2} (ref >59)
GFR, EST NON AFRICAN AMERICAN: 100 mL/min/{1.73_m2} (ref >59)
GLUCOSE: 117 mg/dL — AB (ref 65–99)
Globulin, Total: 2.4 g/dL (ref 1.5–4.5)
Potassium: 4.5 mmol/L (ref 3.5–5.2)
SODIUM: 139 mmol/L (ref 134–144)
Total Protein: 7.2 g/dL (ref 6.0–8.5)

## 2017-07-30 LAB — MICROALBUMIN / CREATININE URINE RATIO
Creatinine, Urine: 174.8 mg/dL
MICROALB/CREAT RATIO: 16.6 mg/g{creat} (ref 0.0–30.0)
MICROALBUM., U, RANDOM: 29.1 ug/mL

## 2017-07-30 LAB — CBC
Hematocrit: 49.8 % (ref 37.5–51.0)
Hemoglobin: 17.4 g/dL (ref 13.0–17.7)
MCH: 31.2 pg (ref 26.6–33.0)
MCHC: 34.9 g/dL (ref 31.5–35.7)
MCV: 89 fL (ref 79–97)
Platelets: 268 10*3/uL (ref 150–379)
RBC: 5.58 x10E6/uL (ref 4.14–5.80)
RDW: 13.8 % (ref 12.3–15.4)
WBC: 9.4 10*3/uL (ref 3.4–10.8)

## 2017-07-30 LAB — LIPID PANEL
CHOL/HDL RATIO: 5.3 ratio — AB (ref 0.0–5.0)
CHOLESTEROL TOTAL: 201 mg/dL — AB (ref 100–199)
HDL: 38 mg/dL — ABNORMAL LOW (ref >39)
Triglycerides: 605 mg/dL (ref 0–149)

## 2017-07-31 ENCOUNTER — Telehealth: Payer: Self-pay | Admitting: Family Medicine

## 2017-07-31 ENCOUNTER — Other Ambulatory Visit: Payer: Self-pay | Admitting: Family Medicine

## 2017-07-31 DIAGNOSIS — I10 Essential (primary) hypertension: Secondary | ICD-10-CM

## 2017-07-31 MED ORDER — ATORVASTATIN CALCIUM 40 MG PO TABS: 40.0000 mg | ORAL_TABLET | Freq: Every day | ORAL | 3 refills | Status: DC

## 2017-07-31 MED ORDER — LOSARTAN POTASSIUM 50 MG PO TABS: 50.0000 mg | ORAL_TABLET | Freq: Every day | ORAL | 0 refills | Status: DC

## 2017-07-31 NOTE — Telephone Encounter (Signed)
Called patient to discuss lab results. No answer, left detailed VM to following effect:  BP elevated with normal Cr - sent in losartan. Needs BMP checked in 2 weeks, f/u BP check in 1 month.  HLD - cholesterol elevated, needs to start statin. Sent lipitor.

## 2017-07-31 NOTE — Progress Notes (Signed)
Future order for BMP. 

## 2017-10-08 IMAGING — CR DG CHEST 2V
2 series · 2 of 2 positions shown · non-contrast
Comparison: Chest x-ray of February 07, 2013
COMPARISON: None in PACs

CLINICAL DATA: Increased marijuana smoking over the past 2 weeks.
CLINICAL DATA: Increased left wrist pain for the past 2 weeks,
history of old trauma with compound fracture 6 years ago

EXAM:
CHEST  2 VIEW

[w chest pa]
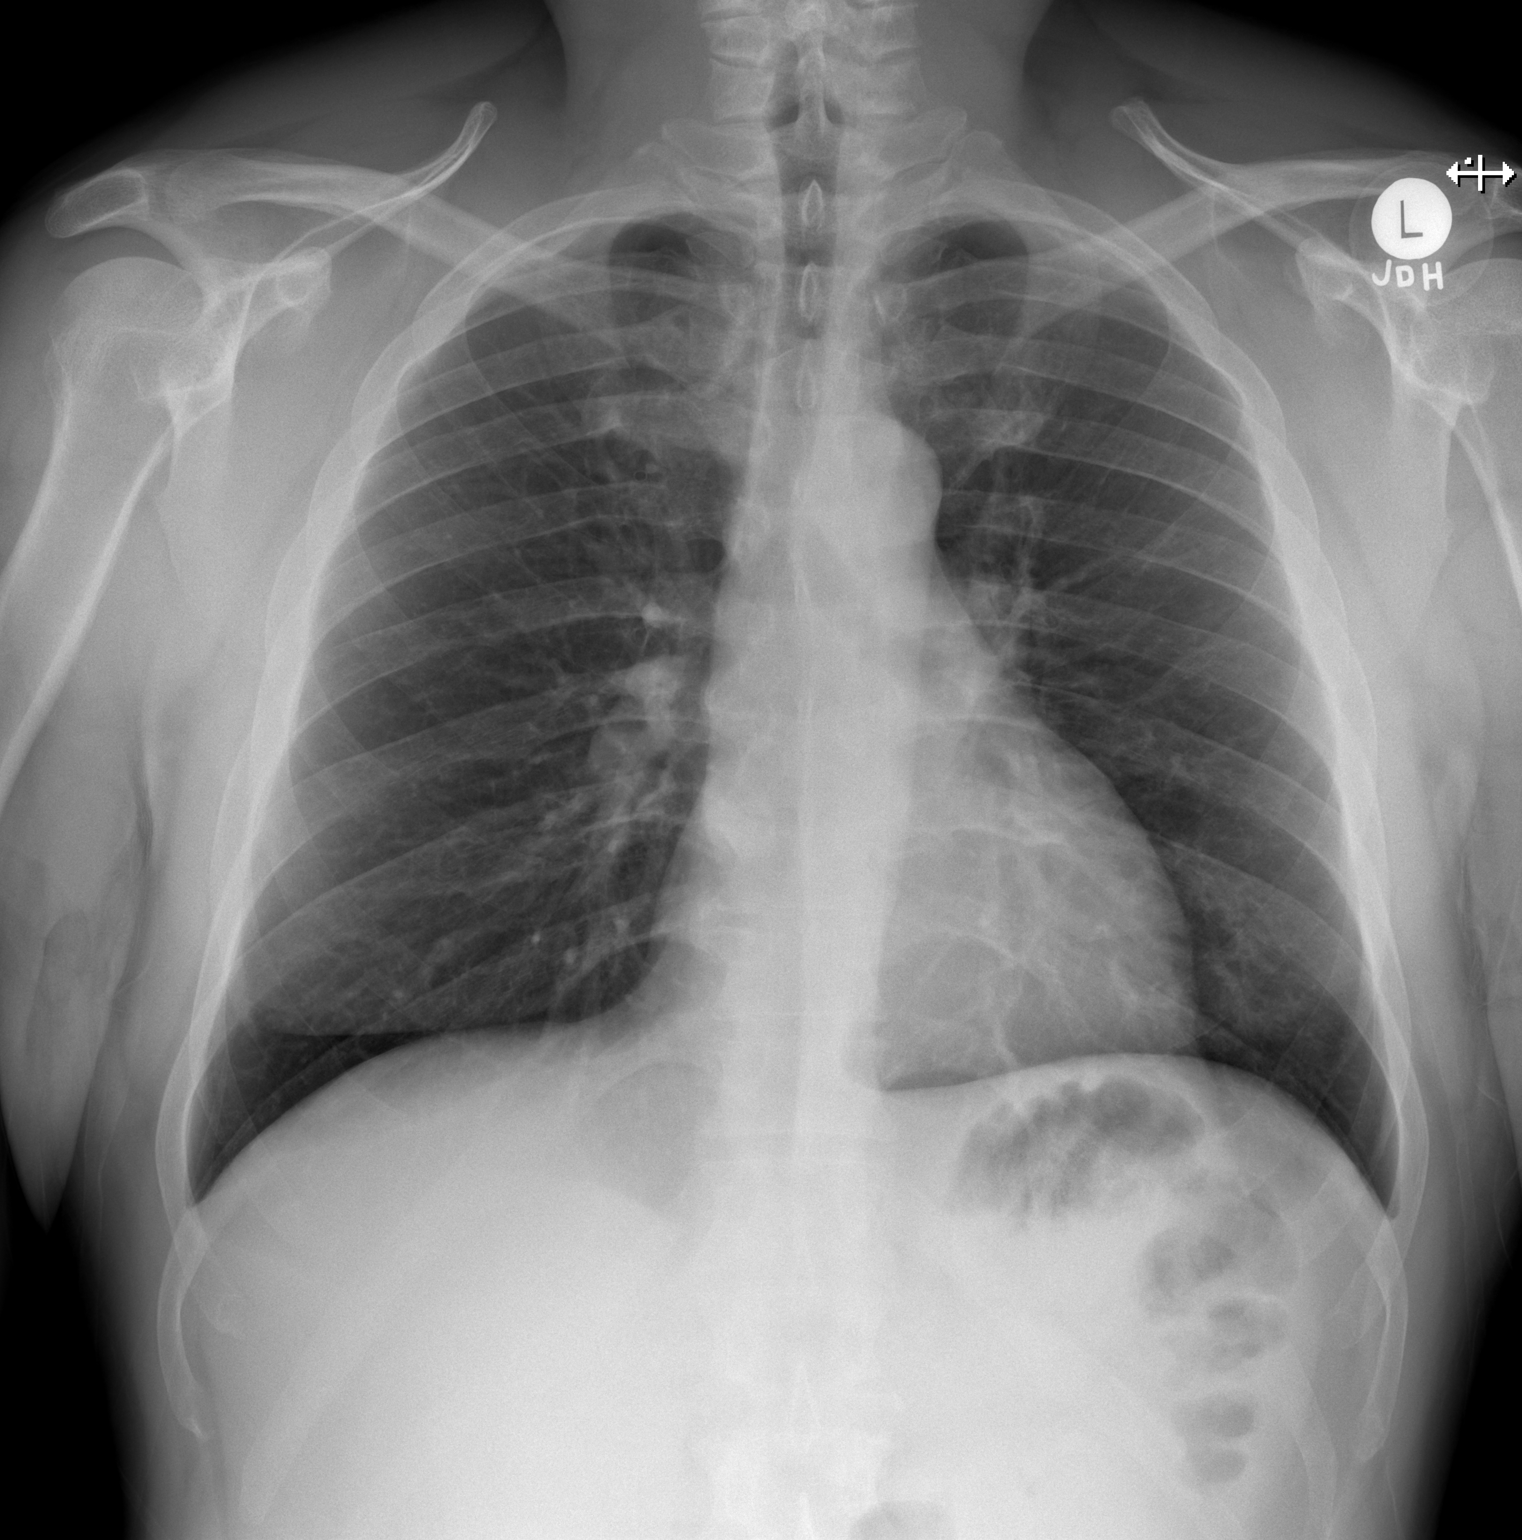

[w chest lat]
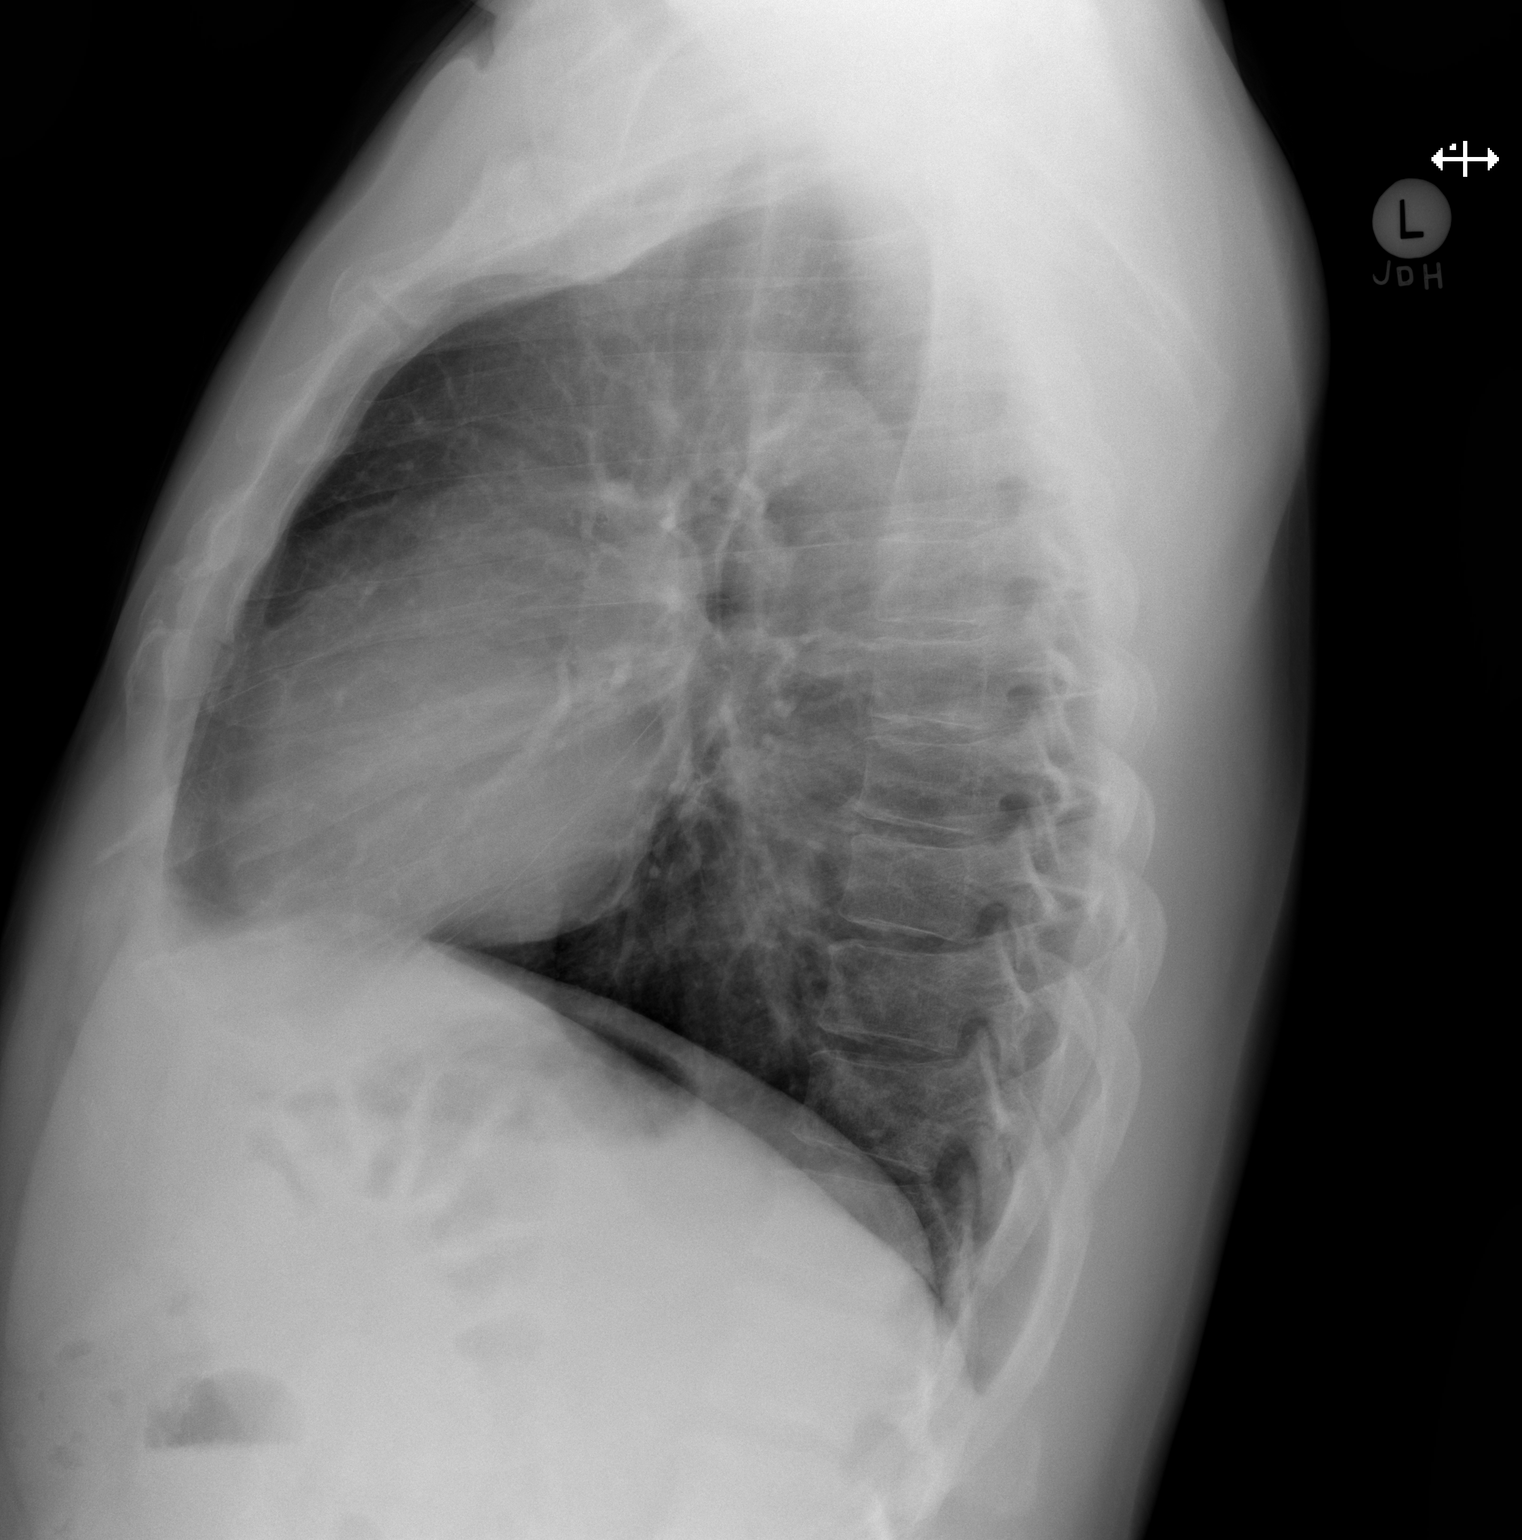

[2 of 2 positions shown; findings below may reference images not displayed]

FINDINGS: The lungs are well-expanded and clear. The heart and pulmonary
vascularity are normal. The mediastinum is normal in width. There is
no pleural effusion. The trachea is midline. The bony thorax
exhibits no acute abnormality.
IMPRESSION: There is no active cardiopulmonary disease.
FINDINGS: There is deformity of the distal left radial meta diaphysis. There
is some narrowing of the radio carpal joint. The adjacent ulna is
intact. There is mild degenerative narrowing of the intercarpal
joints. No acute fracture or dislocation of the distal radius or
ulna or of the carpal bones is observed. The metacarpals appear
intact.
IMPRESSION: Chronic deformity of the distal radius consistent with previous
compound fracture. There is superimposed degenerative change of the
radiocarpal and ulnocarpal and intercarpal joints. If the patient's
symptoms warrant further evaluation, MRI would likely be the most
useful next imaging step.

## 2018-02-14 ENCOUNTER — Encounter: Payer: Self-pay | Admitting: Family Medicine

## 2018-02-14 ENCOUNTER — Other Ambulatory Visit: Payer: Self-pay

## 2018-02-14 ENCOUNTER — Telehealth: Payer: Self-pay

## 2018-02-14 ENCOUNTER — Ambulatory Visit: Payer: Medicare HMO | Admitting: Family Medicine

## 2018-02-14 VITALS — BP 152/102 | HR 87 | Temp 98.4°F | Ht 72.0 in | Wt 198.6 lb

## 2018-02-14 DIAGNOSIS — I10 Essential (primary) hypertension: Secondary | ICD-10-CM

## 2018-02-14 DIAGNOSIS — E1121 Type 2 diabetes mellitus with diabetic nephropathy: Secondary | ICD-10-CM | POA: Diagnosis not present

## 2018-02-14 DIAGNOSIS — B360 Pityriasis versicolor: Secondary | ICD-10-CM | POA: Insufficient documentation

## 2018-02-14 DIAGNOSIS — M1A0710 Idiopathic chronic gout, right ankle and foot, without tophus (tophi): Secondary | ICD-10-CM

## 2018-02-14 LAB — POCT GLYCOSYLATED HEMOGLOBIN (HGB A1C): HbA1c, POC (controlled diabetic range): 6.7 % (ref 0.0–7.0)

## 2018-02-14 MED ORDER — KETOCONAZOLE 2 % EX SHAM: MEDICATED_SHAMPOO | Freq: Once | CUTANEOUS | Status: DC

## 2018-02-14 MED ORDER — KETOCONAZOLE 2 % EX SHAM: 1.0000 "application " | MEDICATED_SHAMPOO | CUTANEOUS | 0 refills | Status: DC

## 2018-02-14 MED ORDER — NAPROXEN 500 MG PO TABS: 500.0000 mg | ORAL_TABLET | Freq: Two times a day (BID) | ORAL | 0 refills | Status: DC | PRN

## 2018-02-14 NOTE — Patient Instructions (Signed)
It was a pleasure to see you today! Thank you for choosing [facility] for your primary care. Clayton Kennedy was seen for rash, HTN, DM.   Our plans for today were:  I will call you about your blood sugar number.   Try the shampoo for the rash, call if not better.   Please take your blood pressure medicines.   Use the naprosen twice daily as needed, for gout flares, call me if you have more flares in the next couple of months.    Best,  Dr. Chanetta Marshallimberlake

## 2018-02-14 NOTE — Telephone Encounter (Signed)
Pt informed. Sharon T Saunders, CMA  

## 2018-02-14 NOTE — Progress Notes (Signed)
   CC: rash, DM, HTN  HPI  Rash - 1 week or so. L neck. Not really itchy, sometimes worse when hot. Tried hydrocortisone QD for about a week. Ketoconazole worked in the past.   Gout - since 1990. Always r great toe. Flared up 2 days ago. Took old naprosen, helped. Has had cochicine in the past.   DM - metformin and glipizide. CBGs occasionally at home, typically 137, highest 142. Tries to limit sweets, stopped drinking tea and stopped candy. Still eats a lot of potatoes and rice. Gabapentin helping with nerve pain in feet.   HTN - didn't take losartan today. No sxs. Feels he takes too many meds.   ROS: Denies CP, SOB, abdominal pain, dysuria, changes in BMs.   CC, SH/smoking status, and VS noted  Objective: BP (!) 152/102   Pulse 87   Temp 98.4 F (36.9 C) (Oral)   Ht 6' (1.829 m)   Wt 198 lb 9.6 oz (90.1 kg)   SpO2 97%   BMI 26.94 kg/m  Gen: NAD, alert, cooperative, and pleasant. HEENT: NCAT, EOMI, PERRL. Papular scaly hyperpigmented rash scattered across L lower neck and upper chest.  CV: RRR, no murmur Resp: CTAB, no wheezes, non-labored Ext: No edema, warm. R great toe mildly erythematous, not warm Neuro: Alert and oriented, Speech clear, No gross deficits  Assessment and plan:  Essential hypertension Didn't take meds today. Reinforced need for compliance with antihypertensive medicines long-term poorly controlled hypertension can be dangerous for CHF, ESRD, increased risk of MI and CVA.  He voices understanding.  Asymptomatic.  Type 2 diabetes mellitus, controlled (HCC) Recheck A1c today, continue metformin and glipizide.  Continue to attempt to decrease carbs in his diet.  Will call with A1c results as he would like to go.  Gout Recent flare of her right great toe.  Normal creatinine at last labs, will prescribe as needed Naprosyn for gout flares.  Can consider colchicine as suppressive therapy if gout flare frequency increases.  He was counseled that Naprosyn is not  a daily or long-term medication.  Tinea versicolor Rash consistent with above.  Will try ketoconazole shampoo as this is preferred with his insurance, return to clinic if no improvement.   Orders Placed This Encounter  Procedures  . POCT glycosylated hemoglobin (Hb A1C)    Meds ordered this encounter  Medications  . DISCONTD: ketoconazole (NIZORAL) 2 % shampoo  . naproxen (NAPROSYN) 500 MG tablet    Sig: Take 1 tablet (500 mg total) by mouth 2 (two) times daily as needed. As needed for gout flares    Dispense:  30 tablet    Refill:  0  . ketoconazole (NIZORAL) 2 % shampoo    Sig: Apply 1 application topically 2 (two) times a week.    Dispense:  120 mL    Refill:  0     Clayton MuseKate Khushbu Pippen, MD, PGY2 02/14/2018 12:56 PM

## 2018-02-14 NOTE — Telephone Encounter (Signed)
-----   Message from Garth BignessKathryn Timberlake, MD sent at 02/14/2018 12:20 PM EDT ----- [facility] team, please let patient know his a1c was 6.7, which is slightly up from before but still well controlled. Continue his current diet and meds and continue to try to exercise.

## 2018-02-14 NOTE — Assessment & Plan Note (Signed)
Recheck A1c today, continue metformin and glipizide.  Continue to attempt to decrease carbs in his diet.  Will call with A1c results as he would like to go.

## 2018-02-14 NOTE — Assessment & Plan Note (Signed)
Recent flare of her right great toe.  Normal creatinine at last labs, will prescribe as needed Naprosyn for gout flares.  Can consider colchicine as suppressive therapy if gout flare frequency increases.  He was counseled that Naprosyn is not a daily or long-term medication.

## 2018-02-14 NOTE — Assessment & Plan Note (Signed)
Rash consistent with above.  Will try ketoconazole shampoo as this is preferred with his insurance, return to clinic if no improvement.

## 2018-02-14 NOTE — Assessment & Plan Note (Signed)
Didn't take meds today. Reinforced need for compliance with antihypertensive medicines long-term poorly controlled hypertension can be dangerous for CHF, ESRD, increased risk of MI and CVA.  He voices understanding.  Asymptomatic.

## 2018-03-05 ENCOUNTER — Telehealth: Payer: Self-pay | Admitting: Family Medicine

## 2018-03-05 NOTE — Telephone Encounter (Signed)
Called patient as [facility] not [facility] his statin. No answer, left VM asking him to call back to discuss barriers.

## 2018-07-29 ENCOUNTER — Other Ambulatory Visit: Payer: Self-pay

## 2018-07-29 ENCOUNTER — Ambulatory Visit (INDEPENDENT_AMBULATORY_CARE_PROVIDER_SITE_OTHER): Payer: Medicare HMO | Admitting: Family Medicine

## 2018-07-29 ENCOUNTER — Encounter: Payer: Self-pay | Admitting: Family Medicine

## 2018-07-29 VITALS — BP 146/78 | HR 94 | Temp 98.2°F | Ht 71.0 in | Wt 193.0 lb

## 2018-07-29 DIAGNOSIS — N529 Male erectile dysfunction, unspecified: Secondary | ICD-10-CM

## 2018-07-29 DIAGNOSIS — S62102D Fracture of unspecified carpal bone, left wrist, subsequent encounter for fracture with routine healing: Secondary | ICD-10-CM | POA: Diagnosis not present

## 2018-07-29 DIAGNOSIS — F1020 Alcohol dependence, uncomplicated: Secondary | ICD-10-CM

## 2018-07-29 DIAGNOSIS — E7841 Elevated Lipoprotein(a): Secondary | ICD-10-CM

## 2018-07-29 DIAGNOSIS — I10 Essential (primary) hypertension: Secondary | ICD-10-CM | POA: Diagnosis not present

## 2018-07-29 DIAGNOSIS — E119 Type 2 diabetes mellitus without complications: Secondary | ICD-10-CM

## 2018-07-29 DIAGNOSIS — R6889 Other general symptoms and signs: Secondary | ICD-10-CM | POA: Diagnosis not present

## 2018-07-29 DIAGNOSIS — M25532 Pain in left wrist: Secondary | ICD-10-CM

## 2018-07-29 DIAGNOSIS — G629 Polyneuropathy, unspecified: Secondary | ICD-10-CM | POA: Insufficient documentation

## 2018-07-29 LAB — POCT GLYCOSYLATED HEMOGLOBIN (HGB A1C): HbA1c, POC (controlled diabetic range): 6.5 % (ref 0.0–7.0)

## 2018-07-29 MED ORDER — GABAPENTIN 300 MG PO CAPS: 600.0000 mg | ORAL_CAPSULE | Freq: Every day | ORAL | 3 refills | Status: AC

## 2018-07-29 MED ORDER — LOSARTAN POTASSIUM 100 MG PO TABS: 100.0000 mg | ORAL_TABLET | Freq: Every day | ORAL | 1 refills | Status: AC

## 2018-07-29 MED ORDER — AMOXICILLIN-POT CLAVULANATE 875-125 MG PO TABS: 1.0000 | ORAL_TABLET | Freq: Two times a day (BID) | ORAL | 0 refills | Status: AC

## 2018-07-29 MED ORDER — ATORVASTATIN CALCIUM 40 MG PO TABS: 40.0000 mg | ORAL_TABLET | Freq: Every day | ORAL | 3 refills | Status: AC

## 2018-07-29 NOTE — Assessment & Plan Note (Signed)
Little use in rechecking lipid panel today given noncompliance, again encouraged patient to take this.

## 2018-07-29 NOTE — Assessment & Plan Note (Signed)
a1c improved, stop glipizide. Recheck 3 months.

## 2018-07-29 NOTE — Patient Instructions (Addendum)
It was a pleasure to see you today! Thank you for choosing [facility] for your primary care. Clayton Kennedy was seen for DM, HTN.   Our plans for today were:  Take 2 of the 50mg  blood pressure medicine (losartan) until you get the new 100mg  tablets from the mail.   STOP your glipizide.   Keep taking the metformin.   Increase your gabapentin to 600mg  at night.   Come back in 10 days to recheck your blood work.   Please take your cholesterol medication.   They will call you about your hand pain.   Best,  Dr. Chanetta Marshallimberlake

## 2018-07-29 NOTE — Assessment & Plan Note (Addendum)
Likely 2/2 poorly controlled DM in the past, but given copious alcohol use, I will check b12. Using 300mg  gabapentin QHS, change to 600mg  QHS.
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
## 2018-07-29 NOTE — Assessment & Plan Note (Signed)
Needs better control, increase cozaar to 100mg  and recheck BMP in 10 days.

## 2018-07-29 NOTE — Assessment & Plan Note (Signed)
Hx of fracture with continued pain, could titrate gabapentin, but patient wants referral to specialist. Referred to SM.

## 2018-07-29 NOTE — Assessment & Plan Note (Signed)
Expect improved control with improved BPs.

## 2018-07-29 NOTE — Assessment & Plan Note (Signed)
Still drinking many beers per day. Counseled against driving.

## 2018-07-30 ENCOUNTER — Telehealth: Payer: Self-pay | Admitting: Family Medicine

## 2018-07-30 LAB — CBC
HEMATOCRIT: 51.7 % — AB (ref 37.5–51.0)
HEMOGLOBIN: 17.9 g/dL — AB (ref 13.0–17.7)
MCH: 30.8 pg (ref 26.6–33.0)
MCHC: 34.6 g/dL (ref 31.5–35.7)
MCV: 89 fL (ref 79–97)
Platelets: 257 10*3/uL (ref 150–450)
RBC: 5.82 x10E6/uL — AB (ref 4.14–5.80)
RDW: 12.5 % (ref 12.3–15.4)
WBC: 8.6 10*3/uL (ref 3.4–10.8)

## 2018-07-30 LAB — VITAMIN B12: VITAMIN B 12: 396 pg/mL (ref 232–1245)

## 2018-07-30 LAB — BASIC METABOLIC PANEL
BUN/Creatinine Ratio: 7 — ABNORMAL LOW (ref 9–20)
BUN: 9 mg/dL (ref 6–24)
CALCIUM: 10.2 mg/dL (ref 8.7–10.2)
CHLORIDE: 95 mmol/L — AB (ref 96–106)
CO2: 20 mmol/L (ref 20–29)
Creatinine, Ser: 1.28 mg/dL — ABNORMAL HIGH (ref 0.76–1.27)
GFR, EST AFRICAN AMERICAN: 73 mL/min/{1.73_m2} (ref >59)
GFR, EST NON AFRICAN AMERICAN: 63 mL/min/{1.73_m2} (ref >59)
Glucose: 104 mg/dL — ABNORMAL HIGH (ref 65–99)
POTASSIUM: 4.7 mmol/L (ref 3.5–5.2)
Sodium: 135 mmol/L (ref 134–144)

## 2018-07-30 NOTE — Telephone Encounter (Signed)
Try to call patient regarding Cr mildly elevated. No answer, left VM asking him to call back. Asked him to: 1. Stop any NSAIDs 2. Encourage Po hydration with water 3. Continue increased dose of losartan  4. Return as we discussed in 10 days to recheck BMP.   Also asked him to call back to make sure he got the message - if he calls please make sure he can teach back the above information.

## 2018-08-11 ENCOUNTER — Ambulatory Visit: Payer: Medicare HMO | Admitting: Sports Medicine

## 2019-06-20 ENCOUNTER — Ambulatory Visit (INDEPENDENT_AMBULATORY_CARE_PROVIDER_SITE_OTHER): Payer: Medicare HMO

## 2019-06-20 ENCOUNTER — Other Ambulatory Visit: Payer: Self-pay

## 2019-06-20 ENCOUNTER — Ambulatory Visit
Admission: EM | Admit: 2019-06-20 | Discharge: 2019-06-20 | Disposition: A | Discharge status: Home / Self Care | Payer: Medicare HMO | Attending: Emergency Medicine | Admitting: Emergency Medicine

## 2019-06-20 DIAGNOSIS — I160 Hypertensive urgency: Secondary | ICD-10-CM

## 2019-06-20 DIAGNOSIS — W19XXXA Unspecified fall, initial encounter: Secondary | ICD-10-CM

## 2019-06-20 DIAGNOSIS — S52511A Displaced fracture of right radial styloid process, initial encounter for closed fracture: Secondary | ICD-10-CM | POA: Diagnosis not present

## 2019-06-20 NOTE — Discharge Instructions (Addendum)
Very important to follow-up with orthopedic on Monday at noon. Please arrive early to the of time of check-in. Go to ER for worsening pain, numbness, inability to move fingers. May take your hydrocodone every 6 hours as needed for pain.
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
## 2022-08-03 ENCOUNTER — Emergency Department (HOSPITAL_COMMUNITY): Payer: Medicare (Managed Care)

## 2022-08-03 ENCOUNTER — Other Ambulatory Visit: Payer: Self-pay

## 2022-08-03 ENCOUNTER — Emergency Department (HOSPITAL_COMMUNITY)
Admission: EM | Admit: 2022-08-03 | Discharge: 2022-08-03 | Disposition: A | Discharge status: Home / Self Care | Payer: Medicare (Managed Care) | Attending: Emergency Medicine | Admitting: Emergency Medicine

## 2022-08-03 ENCOUNTER — Ambulatory Visit
Admission: EM | Admit: 2022-08-03 | Discharge: 2022-08-03 | Payer: Medicare (Managed Care) | Attending: Physician Assistant | Admitting: Physician Assistant

## 2022-08-03 DIAGNOSIS — K2920 Alcoholic gastritis without bleeding: Secondary | ICD-10-CM | POA: Diagnosis not present

## 2022-08-03 DIAGNOSIS — R1013 Epigastric pain: Secondary | ICD-10-CM | POA: Diagnosis not present

## 2022-08-03 DIAGNOSIS — Z7984 Long term (current) use of oral hypoglycemic drugs: Secondary | ICD-10-CM | POA: Diagnosis not present

## 2022-08-03 DIAGNOSIS — I10 Essential (primary) hypertension: Secondary | ICD-10-CM | POA: Insufficient documentation

## 2022-08-03 DIAGNOSIS — F1020 Alcohol dependence, uncomplicated: Secondary | ICD-10-CM | POA: Diagnosis not present

## 2022-08-03 DIAGNOSIS — R0602 Shortness of breath: Secondary | ICD-10-CM | POA: Diagnosis not present

## 2022-08-03 DIAGNOSIS — R079 Chest pain, unspecified: Secondary | ICD-10-CM

## 2022-08-03 DIAGNOSIS — Z79899 Other long term (current) drug therapy: Secondary | ICD-10-CM | POA: Diagnosis not present

## 2022-08-03 DIAGNOSIS — E119 Type 2 diabetes mellitus without complications: Secondary | ICD-10-CM | POA: Insufficient documentation

## 2022-08-03 LAB — CBC
HCT: 51.4 % (ref 39.0–52.0)
Hemoglobin: 18 g/dL — ABNORMAL HIGH (ref 13.0–17.0)
MCH: 31.7 pg (ref 26.0–34.0)
MCHC: 35 g/dL (ref 30.0–36.0)
MCV: 90.7 fL (ref 80.0–100.0)
Platelets: 187 10*3/uL (ref 150–400)
RBC: 5.67 MIL/uL (ref 4.22–5.81)
RDW: 12.2 % (ref 11.5–15.5)
WBC: 11.4 10*3/uL — ABNORMAL HIGH (ref 4.0–10.5)
nRBC: 0 % (ref 0.0–0.2)

## 2022-08-03 LAB — COMPREHENSIVE METABOLIC PANEL
ALT: 30 U/L (ref 0–44)
AST: 34 U/L (ref 15–41)
Albumin: 4.7 g/dL (ref 3.5–5.0)
Alkaline Phosphatase: 51 U/L (ref 38–126)
Anion gap: 16 — ABNORMAL HIGH (ref 5–15)
BUN: 19 mg/dL (ref 6–20)
CO2: 25 mmol/L (ref 22–32)
Calcium: 9.8 mg/dL (ref 8.9–10.3)
Chloride: 92 mmol/L — ABNORMAL LOW (ref 98–111)
Creatinine, Ser: 1.32 mg/dL — ABNORMAL HIGH (ref 0.61–1.24)
GFR, Estimated: >60 mL/min (ref >60)
Glucose, Bld: 162 mg/dL — ABNORMAL HIGH (ref 70–99)
Potassium: 3.2 mmol/L — ABNORMAL LOW (ref 3.5–5.1)
Sodium: 133 mmol/L — ABNORMAL LOW (ref 135–145)
Total Bilirubin: 1.7 mg/dL — ABNORMAL HIGH (ref 0.3–1.2)
Total Protein: 8.3 g/dL — ABNORMAL HIGH (ref 6.5–8.1)

## 2022-08-03 LAB — TROPONIN I (HIGH SENSITIVITY)
Troponin I (High Sensitivity): 8 ng/L (ref <18)
Troponin I (High Sensitivity): 7 ng/L (ref <18)

## 2022-08-03 LAB — LIPASE, BLOOD: Lipase: 74 U/L — ABNORMAL HIGH (ref 11–51)

## 2022-08-03 MED ORDER — FAMOTIDINE 20 MG PO TABS: 20.0000 mg | ORAL_TABLET | Freq: Two times a day (BID) | ORAL | 0 refills | Status: AC

## 2022-08-03 MED ORDER — POTASSIUM CHLORIDE CRYS ER 20 MEQ PO TBCR
40.0000 meq | EXTENDED_RELEASE_TABLET | Freq: Once | ORAL | Status: AC
  Administered 2022-08-03: 40 meq via ORAL
  Filled 2022-08-03: qty 2

## 2022-08-03 MED ORDER — FAMOTIDINE 20 MG PO TABS: 20.0000 mg | ORAL_TABLET | Freq: Two times a day (BID) | ORAL | 0 refills | Status: DC

## 2022-08-03 MED ORDER — ALUM & MAG HYDROXIDE-SIMETH 200-200-20 MG/5ML PO SUSP
30.0000 mL | Freq: Once | ORAL | Status: AC
  Administered 2022-08-03: 30 mL via ORAL
  Filled 2022-08-03: qty 30

## 2022-08-03 MED ORDER — FAMOTIDINE IN NACL 20-0.9 MG/50ML-% IV SOLN
20.0000 mg | Freq: Once | INTRAVENOUS | Status: AC
  Administered 2022-08-03: 20 mg via INTRAVENOUS
  Filled 2022-08-03: qty 50

## 2022-08-03 MED ORDER — ONDANSETRON HCL 4 MG/2ML IJ SOLN
4.0000 mg | Freq: Once | INTRAMUSCULAR | Status: AC
  Administered 2022-08-03: 4 mg via INTRAVENOUS
  Filled 2022-08-03: qty 2

## 2022-08-03 NOTE — ED Provider Notes (Signed)
[facility]    CSN: [id number] Arrival date & time: 08/03/22  1209      History   Chief Complaint Chief Complaint  Patient presents with   Gastroesophageal Reflux    HPI Tiburcio Linder is a 61 y.o. male.   Patient here today with right for evaluation of epigastric pain, intermittent vomiting and diarrhea that started several days ago.  He reports that he has also experienced some left-sided chest pain and shortness of breath.  He states that deep breathing seems to worsen chest pain.  He does not report fever.  The history is provided by the patient.  Gastroesophageal Reflux Associated symptoms include abdominal pain.    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 Authorizing Provider  atorvastatin (LIPITOR) 40 MG tablet Take 1 tablet (40 mg total) by mouth daily. 07/29/18   Shon Hale, MD  gabapentin (NEURONTIN) 300 MG capsule Take 2 capsules (600 mg total) by mouth at bedtime. 07/29/18   Shon Hale, MD  ketoconazole (NIZORAL) 2 % shampoo Apply 1 application topically 2 (two) times a week. 02/17/18   Shon Hale, MD   losartan (COZAAR) 100 MG tablet Take 1 tablet (100 mg total) by mouth daily. 07/29/18   Shon Hale, MD  metFORMIN (GLUCOPHAGE) 500 MG tablet Take 1 tablet (500 mg total) 2 (two) times daily with a meal by mouth. 07/29/17   Shon Hale, MD  naproxen (NAPROSYN) 500 MG tablet Take 1 tablet (500 mg total) by mouth 2 (two) times daily as needed. As needed for gout flares 02/14/18   Shon Hale, MD    Family History Family History  Problem Relation Age of Onset   Cancer Mother        Esophageal   Alcohol abuse Father    Diabetes Father    Cancer Sister        uterine, esophageal   Stroke Maternal Aunt    Cancer Maternal Uncle        throat    Social History Social History   Tobacco Use   Smoking status: Never   Smokeless tobacco: Never  Substance Use Topics   Alcohol use: Yes    Alcohol/week: 42.0 standard drinks of alcohol    Types: 42 Cans of beer per week   Drug use: Yes    Frequency: 21.0 times per week    Types: Marijuana    Comment: 3 per day     Allergies   Patient has no known allergies.   Review of Systems Review of Systems  Constitutional:  Negative for chills and fever.  Eyes:  Negative for discharge and redness.  Gastrointestinal:  Positive for abdominal pain, diarrhea, nausea and vomiting.  Neurological:  Negative for numbness.     Physical Exam Triage Vital Signs ED Triage Vitals  Enc Vitals Group     BP 08/03/22 1344 (!) 148/88     Pulse Rate 08/03/22 1344 (!) 109     Resp 08/03/22 1344 18     Temp 08/03/22 1344 98.1 F (36.7 C)     Temp Source 08/03/22 1344 Oral     SpO2 08/03/22 1344 98 %     Weight --      Height --      Head Circumference --      Peak Flow --      Pain Score 08/03/22 1343 6     Pain Loc --      Pain Edu? --      Excl. in GC? --    No data found.  Updated Vital Signs BP (!) 148/88 (BP Location: Left Arm)   Pulse (!) 109   Temp 98.1 F (36.7 C) (Oral)   Resp 18   SpO2 98%      Physical Exam Vitals and nursing note reviewed.  Constitutional:      General: He is not in acute distress.    Appearance: Normal appearance. He is not ill-appearing.  HENT:     Head: Normocephalic and atraumatic.  Eyes:     Conjunctiva/sclera: Conjunctivae normal.  Cardiovascular:     Rate and Rhythm: Tachycardia present.  Pulmonary:     Effort: Pulmonary effort is normal. No respiratory distress.     Breath sounds: Normal breath sounds. No wheezing, rhonchi or rales.  Neurological:     Mental Status: He is alert.  Psychiatric:        Mood and Affect: Mood normal.        Behavior: Behavior normal.        Thought Content: Thought content normal.      UC Treatments / Results  Labs (all labs ordered are listed, but only abnormal results are displayed) Labs Reviewed - No data to display  EKG   Radiology No results found.  Procedures Procedures (including critical care time)  Medications Ordered in UC Medications - No data to display  Initial Impression / Assessment and Plan / UC Course  I have reviewed the triage vital signs and the nursing notes.  Pertinent labs & imaging results that were available during my care of the patient were reviewed by me and considered in my medical decision making (see chart for details).    EKG without significant changes compared to prior however given symptoms and past medical history recommended further evaluation in the emergency room for stat labs.  Patient refuses emergency room evaluation.  Discussed that this decision was AGAINST MEDICAL ADVICE.  Patient continues to decline emergency room evaluation.  Final Clinical Impressions(s) / UC Diagnoses   Final diagnoses:  Chest pain, unspecified type  Shortness of breath  Abdominal pain, epigastric   Discharge Instructions   None    ED Prescriptions   None    PDMP not reviewed this encounter.   Tomi Bamberger, PA-C 08/03/22 1542

## 2022-08-03 NOTE — Discharge Instructions (Addendum)
Thank you for coming to [facility] Emergency Department. You were seen for burning chest and abdominal pain. We did an exam, labs, and imaging, and these showed mildly low potassium, slightly elevated total bilirubin (liver lab), and likely gastritis, or inflammation of the lining of the stomach.  You improved after Pepcid and antacid medication as well as Maalox.  Please take Pepcid 20 mg twice per day for 30 days.  You can also use Maalox as needed for symptoms of reflux/gastritis.Marland Kitchen  Please follow up with your primary care provider within 1 week she discussed the symptoms and have your labs rechecked.  You may need to be seen by gastroenterologist to have an endoscopy.  Please decrease her alcohol intake as this can make gastritis worse.  Smoking tobacco, caffeine, and chocolate can also make it worse.  Please be mindful of alcohol withdrawal which include symptoms like nausea vomiting, tremors, anxiety, irritability, sweating, and if you experience the symptoms please come to the emergency department.  Do not hesitate to return to the ED or call 911 if you experience: -Worsening symptoms -Inability to eat or drink -Lightheadedness, passing out -Fevers/chills -Anything else that concerns you

## 2022-08-03 NOTE — ED Notes (Signed)
Pt requesting to speak to doctor before drawing troponin. MD Jearld Fenton made aware.

## 2022-08-03 NOTE — ED Provider Triage Note (Signed)
Emergency Medicine Provider Triage Evaluation Note  Clayton Kennedy , a 61 y.o. male  was evaluated in triage.  Pt complains of upper abdominal pain and burning pain into the chest.  He was seen at urgent care prior to arrival and referred to the emergency department.  There he had slightly prolonged QT on EKG, otherwise unremarkable.  Patient has a history of diabetes.  States that this feels different than episodes of GERD and gastroenteritis that he has had in the past.  He has had some nausea.  No fevers, cough.  Review of Systems  Positive: Chest pain, abdominal pain, nausea Negative: Fever  Physical Exam  BP (!) 142/87 (BP Location: Right Arm)   Pulse 98   Temp 97.7 F (36.5 C) (Oral)   Resp 18   SpO2 99%  Gen:   Awake, no distress   Resp:  Normal effort  MSK:   Moves extremities without difficulty  Other:    Medical Decision Making  Medically screening exam initiated at 3:27 PM.  Appropriate orders placed.  Clayton Kennedy was informed that the remainder of the evaluation will be completed by another provider, this initial triage assessment does not replace that evaluation, and the importance of remaining in the ED until their evaluation is complete.     Clayton Crigler, PA-C 08/03/22 1528

## 2022-08-03 NOTE — ED Notes (Signed)
Patient is being discharged from the Urgent Care and sent to the Emergency Department via personal vehicle with significant other . Per Provider Erma Pinto, patient is in need of higher level of care due to chest pain. Patient is aware and verbalizes understanding of plan of care.   Vitals:   08/03/22 1344  BP: (!) 148/88  Pulse: (!) 109  Resp: 18  Temp: 98.1 F (36.7 C)  SpO2: 98%

## 2022-08-03 NOTE — ED Provider Notes (Signed)
[facility] EMERGENCY DEPARTMENT Provider Note   CSN: [id number] Arrival date & time: 08/03/22  1518     History  Chief Complaint  Patient presents with   Chest Pain    Rhythm Alatorre is a 61 y.o. male with HLD, HTN, T2DM, alcohol dependence, gout presents with chest pain.   Patient states that he has had burning central chest pain that has been ongoing since Tuesday associated with nausea vomiting that is worse with food consumption.  He also had an episode of feeling febrile that went away after taking some "tablets" but he is not sure what they are called.  He feels hungry because he has not been able to eat anything and the pain is intermittently severe.  Patient states he drinks about a pint of alcohol every day but did not have any increase in his alcohol consumption this weekend or on Monday.  He reports he has never had pancreatitis before.  Endorses a remote history of diverticulitis that was treated with surgery but otherwise has had no surgery on his abdominal region.  Denies any heart history, does not take any medications for reflux.  Denies any shortness of breath as he sits here now but endorses that sometimes when he begins exerting himself he feels slightly short of breath, like going up the stairs in his home.  Denies urinary symptoms, hematochezia/melena, lower extremity edema.  Patient was seen earlier today at [facility] urgent care for epigastric pain, intermittent vomiting diarrhea times several days as well as left-sided chest pain and shortness of breath.  Was recommended go to the emergency department.  EKG demonstrated prolonged QT but otherwise no ischemic changes.   Chest Pain      Home Medications Prior to Admission medications   Medication Sig Start Date End Date Taking? Authorizing Provider  famotidine (PEPCID) 20 MG tablet Take 1 tablet (20 mg total) by mouth 2 (two) times daily. 08/03/22  Yes Loetta Rough, MD  atorvastatin (LIPITOR) 40 MG  tablet Take 1 tablet (40 mg total) by mouth daily. 07/29/18   Shon Hale, MD  gabapentin (NEURONTIN) 300 MG capsule Take 2 capsules (600 mg total) by mouth at bedtime. 07/29/18   Shon Hale, MD  ketoconazole (NIZORAL) 2 % shampoo Apply 1 application topically 2 (two) times a week. 02/17/18   Shon Hale, MD  losartan (COZAAR) 100 MG tablet Take 1 tablet (100 mg total) by mouth daily. 07/29/18   Shon Hale, MD  metFORMIN (GLUCOPHAGE) 500 MG tablet Take 1 tablet (500 mg total) 2 (two) times daily with a meal by mouth. 07/29/17   Shon Hale, MD  naproxen (NAPROSYN) 500 MG tablet Take 1 tablet (500 mg total) by mouth 2 (two) times daily as needed. As needed for gout flares 02/14/18   Shon Hale, MD      Allergies    Patient has no known allergies.    Review of Systems   Review of Systems  Cardiovascular:  Positive for chest pain.   Review of systems positive for burning chest pain.  A 10 point review of systems was performed and is negative unless otherwise reported in HPI.  Physical Exam Updated Vital Signs BP (!) 141/90   Pulse 84   Temp 97.7 F (36.5 C) (Oral)   Resp 17   SpO2 99%  Physical Exam General: Normal appearing male, pacing around the room.  HEENT: PERRLA, Sclera anicteric, MMM, trachea midline. Cardiology: RRR, no murmurs/rubs/gallops. BL  radial and DP pulses equal bilaterally.  Resp: Normal respiratory rate and effort. CTAB, no wheezes, rhonchi, crackles.  Abd: Soft, non-tender, non-distended. No rebound tenderness or guarding. No epigastric tenderness to palpation. MSK: No peripheral edema or signs of trauma. Extremities without deformity or TTP. No cyanosis or clubbing. Skin: warm, dry. No rashes or lesions. Neuro: A&Ox4, CNs II-XII grossly intact. MAEs. Sensation grossly intact.  Psych: Irritable mood and affect.   ED Results / Procedures / Treatments   Labs (all labs ordered are listed, but only  abnormal results are displayed) Labs Reviewed  CBC - Abnormal; Notable for the following components:      Result Value   WBC 11.4 (*)    Hemoglobin 18.0 (*)    All other components within normal limits  COMPREHENSIVE METABOLIC PANEL - Abnormal; Notable for the following components:   Sodium 133 (*)    Potassium 3.2 (*)    Chloride 92 (*)    Glucose, Bld 162 (*)    Creatinine, Ser 1.32 (*)    Total Protein 8.3 (*)    Total Bilirubin 1.7 (*)    Anion gap 16 (*)    All other components within normal limits  LIPASE, BLOOD - Abnormal; Notable for the following components:   Lipase 74 (*)    All other components within normal limits  TROPONIN I (HIGH SENSITIVITY)  TROPONIN I (HIGH SENSITIVITY)    EKG EKG Interpretation  Date/Time:  Friday August 03 2022 15:16:07 EST Ventricular Rate:  100 PR Interval:  156 QRS Duration: 86 QT Interval:  368 QTC Calculation: 474 R Axis:   88 Text Interpretation: Normal sinus rhythm Normal ECG When compared with ECG of 03-Aug-2022 14:36, PREVIOUS ECG IS PRESENT Confirmed by Cindee Lame [phone]) on 08/03/2022 6:30:35 PM  Radiology DG Chest 2 View  Result Date: 08/03/2022 CLINICAL DATA:  Chest pain. EXAM: CHEST - 2 VIEW COMPARISON:  12/23/2015 FINDINGS: The cardiac silhouette, mediastinal and hilar contours are normal. The lungs are clear. No pleural effusions. No pulmonary lesions. The bony thorax is intact. IMPRESSION: No acute cardiopulmonary findings. Electronically Signed   By: Marijo Sanes M.D.   On: 08/03/2022 15:58    Procedures Procedures    Medications Ordered in ED Medications  alum & mag hydroxide-simeth (MAALOX/MYLANTA) 200-200-20 MG/5ML suspension 30 mL (30 mLs Oral Given 08/03/22 1845)  famotidine (PEPCID) IVPB 20 mg premix (0 mg Intravenous Stopped 08/03/22 1926)  ondansetron (ZOFRAN) injection 4 mg (4 mg Intravenous Given 08/03/22 1845)  potassium chloride SA (KLOR-CON M) CR tablet 40 mEq (40 mEq Oral Given 08/03/22 1845)     ED Course/ Medical Decision Making/ A&P                          Medical Decision Making Amount and/or Complexity of Data Reviewed Labs:  Decision-making details documented in ED Course. Radiology:  Decision-making details documented in ED Course.  Risk OTC drugs. Prescription drug management.    Patient is HDS, afebrile, overall well-appearing.  For patient's burning central chest pain in the setting of alcohol use, high concern for PUD/gastritis versus pancreatitis.  Patient states he has never had pancreatitis before evaluate with a lipase, however he has no epigastric tenderness palpation.  EKG at urgent care did not demonstrate any signs of ischemia, will obtain serial troponins here to evaluate for ACS/ischemia.  Patient has no right upper quadrant pain to suggest biliary colic/cholecystitis.  No urinary symptoms to suggest pyelonephritis.  Patient  is not tachycardic and has no tremors here, low concern for acute alcohol withdrawal.  Labs demonstrate K 3.2 which was repleted, sodium 133, glucose 162, creatinine 1.32 up from 1.28 in 2019, baseline prior to that was 0.8-1.  Slightly elevated lipase at 74, T. bili 1.7 up from 0.4 in 2018. WBC 11.4, Hgb 18.0.   I have personally reviewed and interpreted all labs and imaging.  Patient was maintained on a cardiac monitor.  I have personally interpreted the telemetry as NSR.  Clinical Course as of 08/03/22 1945  Fri Aug 03, 2022  1740 Lipase(!): 74 [HN]  1740 Troponin I (High Sensitivity): 8 [HN]  1740 WBC(!): 11.4 [HN]  1740 Hemoglobin(!): 18.0 [HN]  1742 DG Chest 2 View No acute cardiopulmonary findings. [HN]  1926 Troponin I (High Sensitivity): 7 [HN]  1939 Patient states that his symptoms have improved after the maalox, pepcid, and zofran. Discussed with patient that he likely has gastritis/PUD/GERD.  Patient does have a slightly elevated lipase but without any epigastric tenderness palpation and I believe pancreatitis is  unlikely.  Patient is notified of this elevation as well as of his slight elevation in his total bilirubin and he is advised to follows up with his primary care physician.  Discussed that drinking alcohol can make this much worse and that he is advised to cut back on his alcohol intake.  Advised patient and his partner at bedside that he will need to watch for signs and symptoms of withdrawal and if he experiences those such as nausea vomiting, shakes, diaphoresis, anxiety that he will need to come to the emergency department.  Patient reports understanding.  Informed patient to take Pepcid twice per day for 30 days and will send prescription.  Also advised Maalox as needed for symptoms.  Patient states that he will see his primary care physician and an appointment on 08/09/2022 and encouraged him to discuss the symptoms as he may need to see gastroenterology for an endoscopy at some point.  Given patient discharge instructions return precautions, all questions answered to patient's satisfaction. [HN]    Clinical Course User Index [HN] Loetta Rough, MD          Final Clinical Impression(s) / ED Diagnoses Final diagnoses:  Acute alcoholic gastritis without hemorrhage    Rx / DC Orders ED Discharge Orders          Ordered    famotidine (PEPCID) 20 MG tablet  2 times daily        08/03/22 1945             This note was created using dictation software, which may contain spelling or grammatical errors.    Loetta Rough, MD 08/03/22 1945

## 2022-08-03 NOTE — ED Triage Notes (Signed)
Pt states he is still having some intermittent vomiting and diarrhea but has not really eaten anything since yesterday.

## 2022-08-03 NOTE — ED Triage Notes (Signed)
Pt presents after being seen at urgent care for n/v/d and burning in his chest with swallowing.  Pt states he was told by urgent care to come in to the ED for further eval of his chest pain.

## 2022-08-03 NOTE — ED Triage Notes (Signed)
Pt presents with epigastric burning X 3 days; pt states he recently recovered from having the flu.

## 2022-11-13 ENCOUNTER — Emergency Department (HOSPITAL_COMMUNITY): Payer: Medicare (Managed Care)

## 2022-11-13 ENCOUNTER — Encounter (HOSPITAL_COMMUNITY): Payer: Self-pay | Admitting: Emergency Medicine

## 2022-11-13 ENCOUNTER — Observation Stay (HOSPITAL_COMMUNITY)
Admission: EM | Admit: 2022-11-13 | Discharge: 2022-11-15 | Disposition: A | Discharge status: Home / Self Care | Payer: Medicare (Managed Care) | Attending: Family Medicine | Admitting: Family Medicine

## 2022-11-13 ENCOUNTER — Other Ambulatory Visit: Payer: Self-pay

## 2022-11-13 DIAGNOSIS — K2970 Gastritis, unspecified, without bleeding: Secondary | ICD-10-CM | POA: Insufficient documentation

## 2022-11-13 DIAGNOSIS — Z79899 Other long term (current) drug therapy: Secondary | ICD-10-CM | POA: Diagnosis not present

## 2022-11-13 DIAGNOSIS — F10939 Alcohol use, unspecified with withdrawal, unspecified: Secondary | ICD-10-CM

## 2022-11-13 DIAGNOSIS — R112 Nausea with vomiting, unspecified: Secondary | ICD-10-CM | POA: Insufficient documentation

## 2022-11-13 DIAGNOSIS — R2681 Unsteadiness on feet: Secondary | ICD-10-CM | POA: Insufficient documentation

## 2022-11-13 DIAGNOSIS — A084 Viral intestinal infection, unspecified: Secondary | ICD-10-CM | POA: Diagnosis not present

## 2022-11-13 DIAGNOSIS — R109 Unspecified abdominal pain: Secondary | ICD-10-CM | POA: Insufficient documentation

## 2022-11-13 DIAGNOSIS — U071 COVID-19: Secondary | ICD-10-CM | POA: Diagnosis not present

## 2022-11-13 DIAGNOSIS — K2900 Acute gastritis without bleeding: Secondary | ICD-10-CM | POA: Insufficient documentation

## 2022-11-13 DIAGNOSIS — R0789 Other chest pain: Secondary | ICD-10-CM | POA: Diagnosis present

## 2022-11-13 DIAGNOSIS — D751 Secondary polycythemia: Secondary | ICD-10-CM | POA: Insufficient documentation

## 2022-11-13 DIAGNOSIS — E119 Type 2 diabetes mellitus without complications: Secondary | ICD-10-CM | POA: Diagnosis not present

## 2022-11-13 DIAGNOSIS — I10 Essential (primary) hypertension: Secondary | ICD-10-CM | POA: Diagnosis present

## 2022-11-13 DIAGNOSIS — F10239 Alcohol dependence with withdrawal, unspecified: Principal | ICD-10-CM | POA: Insufficient documentation

## 2022-11-13 DIAGNOSIS — R7402 Elevation of levels of lactic acid dehydrogenase (LDH): Secondary | ICD-10-CM | POA: Insufficient documentation

## 2022-11-13 DIAGNOSIS — Z7984 Long term (current) use of oral hypoglycemic drugs: Secondary | ICD-10-CM | POA: Diagnosis not present

## 2022-11-13 DIAGNOSIS — R079 Chest pain, unspecified: Secondary | ICD-10-CM | POA: Diagnosis not present

## 2022-11-13 DIAGNOSIS — E872 Acidosis, unspecified: Secondary | ICD-10-CM | POA: Insufficient documentation

## 2022-11-13 LAB — URINALYSIS, ROUTINE W REFLEX MICROSCOPIC
Glucose, UA: >=500 mg/dL — AB
Ketones, ur: 80 mg/dL — AB
Leukocytes,Ua: NEGATIVE
Nitrite: NEGATIVE
Protein, ur: >=300 mg/dL — AB
Specific Gravity, Urine: 1.029 (ref 1.005–1.030)
pH: 5 (ref 5.0–8.0)

## 2022-11-13 LAB — COMPREHENSIVE METABOLIC PANEL
ALT: 56 U/L — ABNORMAL HIGH (ref 0–44)
AST: 65 U/L — ABNORMAL HIGH (ref 15–41)
Albumin: 5.2 g/dL — ABNORMAL HIGH (ref 3.5–5.0)
Alkaline Phosphatase: 64 U/L (ref 38–126)
Anion gap: 29 — ABNORMAL HIGH (ref 5–15)
BUN: 18 mg/dL (ref 6–20)
CO2: 17 mmol/L — ABNORMAL LOW (ref 22–32)
Calcium: 10.2 mg/dL (ref 8.9–10.3)
Chloride: 89 mmol/L — ABNORMAL LOW (ref 98–111)
Creatinine, Ser: 1.58 mg/dL — ABNORMAL HIGH (ref 0.61–1.24)
GFR, Estimated: 50 mL/min — ABNORMAL LOW (ref >60)
Glucose, Bld: 263 mg/dL — ABNORMAL HIGH (ref 70–99)
Potassium: 4.1 mmol/L (ref 3.5–5.1)
Sodium: 135 mmol/L (ref 135–145)
Total Bilirubin: 2.4 mg/dL — ABNORMAL HIGH (ref 0.3–1.2)
Total Protein: 8.8 g/dL — ABNORMAL HIGH (ref 6.5–8.1)

## 2022-11-13 LAB — I-STAT VENOUS BLOOD GAS, ED
Acid-Base Excess: 3 mmol/L — ABNORMAL HIGH (ref 0.0–2.0)
Bicarbonate: 25.2 mmol/L (ref 20.0–28.0)
Calcium, Ion: 1.05 mmol/L — ABNORMAL LOW (ref 1.15–1.40)
HCT: 57 % — ABNORMAL HIGH (ref 39.0–52.0)
Hemoglobin: 19.4 g/dL — ABNORMAL HIGH (ref 13.0–17.0)
O2 Saturation: 65 %
Potassium: 3.8 mmol/L (ref 3.5–5.1)
Sodium: 133 mmol/L — ABNORMAL LOW (ref 135–145)
TCO2: 26 mmol/L (ref 22–32)
pCO2, Ven: 32.1 mmHg — ABNORMAL LOW (ref 44–60)
pH, Ven: 7.503 — ABNORMAL HIGH (ref 7.25–7.43)
pO2, Ven: 30 mmHg — CL (ref 32–45)

## 2022-11-13 LAB — CBC WITH DIFFERENTIAL/PLATELET
Abs Immature Granulocytes: 0.02 10*3/uL (ref 0.00–0.07)
Basophils Absolute: 0 10*3/uL (ref 0.0–0.1)
Basophils Relative: 1 %
Eosinophils Absolute: 0.3 10*3/uL (ref 0.0–0.5)
Eosinophils Relative: 3 %
HCT: 52.8 % — ABNORMAL HIGH (ref 39.0–52.0)
Hemoglobin: 17.9 g/dL — ABNORMAL HIGH (ref 13.0–17.0)
Immature Granulocytes: 0 %
Lymphocytes Relative: 6 %
Lymphs Abs: 0.5 10*3/uL — ABNORMAL LOW (ref 0.7–4.0)
MCH: 30.8 pg (ref 26.0–34.0)
MCHC: 33.9 g/dL (ref 30.0–36.0)
MCV: 90.9 fL (ref 80.0–100.0)
Monocytes Absolute: 0.7 10*3/uL (ref 0.1–1.0)
Monocytes Relative: 8 %
Neutro Abs: 6.8 10*3/uL (ref 1.7–7.7)
Neutrophils Relative %: 82 %
Platelets: 111 10*3/uL — ABNORMAL LOW (ref 150–400)
RBC: 5.81 MIL/uL (ref 4.22–5.81)
RDW: 12.9 % (ref 11.5–15.5)
WBC: 8.2 10*3/uL (ref 4.0–10.5)
nRBC: 0 % (ref 0.0–0.2)

## 2022-11-13 LAB — LACTIC ACID, PLASMA
Lactic Acid, Venous: 6 mmol/L (ref 0.5–1.9)
Lactic Acid, Venous: 5.4 mmol/L (ref 0.5–1.9)

## 2022-11-13 LAB — ETHANOL: Alcohol, Ethyl (B): <10 mg/dL (ref <10)

## 2022-11-13 LAB — RESP PANEL BY RT-PCR (RSV, FLU A&B, COVID)  RVPGX2
Influenza A by PCR: NEGATIVE
Influenza B by PCR: NEGATIVE
Resp Syncytial Virus by PCR: NEGATIVE
SARS Coronavirus 2 by RT PCR: POSITIVE — AB

## 2022-11-13 LAB — LIPASE, BLOOD: Lipase: 67 U/L — ABNORMAL HIGH (ref 11–51)

## 2022-11-13 LAB — TROPONIN I (HIGH SENSITIVITY)
Troponin I (High Sensitivity): 15 ng/L (ref <18)
Troponin I (High Sensitivity): 15 ng/L (ref <18)

## 2022-11-13 LAB — BETA-HYDROXYBUTYRIC ACID: Beta-Hydroxybutyric Acid: 0.72 mmol/L — ABNORMAL HIGH (ref 0.05–0.27)

## 2022-11-13 MED ORDER — LACTATED RINGERS IV BOLUS (SEPSIS)
1000.0000 mL | Freq: Once | INTRAVENOUS | Status: AC
  Administered 2022-11-13: 1000 mL via INTRAVENOUS

## 2022-11-13 MED ORDER — FOLIC ACID 1 MG PO TABS
1.0000 mg | ORAL_TABLET | Freq: Every day | ORAL | Status: DC
  Administered 2022-11-13 – 2022-11-15 (×3): 1 mg via ORAL
  Filled 2022-11-13 (×3): qty 1

## 2022-11-13 MED ORDER — METRONIDAZOLE 500 MG/100ML IV SOLN
500.0000 mg | Freq: Once | INTRAVENOUS | Status: DC
  Administered 2022-11-13: 500 mg via INTRAVENOUS
  Filled 2022-11-13: qty 100

## 2022-11-13 MED ORDER — FAMOTIDINE 20 MG PO TABS
20.0000 mg | ORAL_TABLET | Freq: Two times a day (BID) | ORAL | Status: DC
  Administered 2022-11-13 – 2022-11-15 (×4): 20 mg via ORAL
  Filled 2022-11-13 (×4): qty 1

## 2022-11-13 MED ORDER — THIAMINE MONONITRATE 100 MG PO TABS
100.0000 mg | ORAL_TABLET | Freq: Every day | ORAL | Status: DC
  Administered 2022-11-13 – 2022-11-15 (×3): 100 mg via ORAL
  Filled 2022-11-13 (×3): qty 1

## 2022-11-13 MED ORDER — LACTATED RINGERS IV BOLUS (SEPSIS)
500.0000 mL | Freq: Once | INTRAVENOUS | Status: AC
  Administered 2022-11-13: 500 mL via INTRAVENOUS

## 2022-11-13 MED ORDER — LORAZEPAM 1 MG PO TABS: 0.0000 mg | ORAL_TABLET | Freq: Two times a day (BID) | ORAL | Status: DC

## 2022-11-13 MED ORDER — SODIUM CHLORIDE 0.9 % IV BOLUS: 1000.0000 mL | Freq: Once | INTRAVENOUS | Status: DC

## 2022-11-13 MED ORDER — ONDANSETRON 4 MG PO TBDP
4.0000 mg | ORAL_TABLET | Freq: Once | ORAL | Status: DC
  Filled 2022-11-13: qty 1

## 2022-11-13 MED ORDER — ATORVASTATIN CALCIUM 40 MG PO TABS
40.0000 mg | ORAL_TABLET | Freq: Every day | ORAL | Status: DC
  Administered 2022-11-14 – 2022-11-15 (×2): 40 mg via ORAL
  Filled 2022-11-13 (×2): qty 1

## 2022-11-13 MED ORDER — SODIUM CHLORIDE 0.9 % IV SOLN: 2.0000 g | Freq: Two times a day (BID) | INTRAVENOUS | Status: DC

## 2022-11-13 MED ORDER — LORAZEPAM 1 MG PO TABS
1.0000 mg | ORAL_TABLET | Freq: Once | ORAL | Status: AC
  Administered 2022-11-13: 1 mg via ORAL
  Filled 2022-11-13: qty 1

## 2022-11-13 MED ORDER — LORAZEPAM 2 MG/ML IJ SOLN
1.0000 mg | INTRAMUSCULAR | Status: DC | PRN
  Administered 2022-11-15: 2 mg via INTRAVENOUS
  Filled 2022-11-13: qty 1

## 2022-11-13 MED ORDER — LORAZEPAM 1 MG PO TABS: 0.0000 mg | ORAL_TABLET | Freq: Four times a day (QID) | ORAL | Status: DC

## 2022-11-13 MED ORDER — LORAZEPAM 1 MG PO TABS
1.0000 mg | ORAL_TABLET | ORAL | Status: DC | PRN
  Administered 2022-11-13 – 2022-11-14 (×2): 2 mg via ORAL
  Filled 2022-11-13 (×2): qty 2

## 2022-11-13 MED ORDER — LORAZEPAM 2 MG/ML IJ SOLN: 0.0000 mg | Freq: Two times a day (BID) | INTRAMUSCULAR | Status: DC

## 2022-11-13 MED ORDER — THIAMINE HCL 100 MG/ML IJ SOLN: 100.0000 mg | Freq: Every day | INTRAMUSCULAR | Status: DC

## 2022-11-13 MED ORDER — VANCOMYCIN HCL IN DEXTROSE 1-5 GM/200ML-% IV SOLN
1000.0000 mg | Freq: Once | INTRAVENOUS | Status: DC
  Administered 2022-11-13: 1000 mg via INTRAVENOUS
  Filled 2022-11-13: qty 200

## 2022-11-13 MED ORDER — ADULT MULTIVITAMIN W/MINERALS CH
1.0000 | ORAL_TABLET | Freq: Every day | ORAL | Status: DC
  Administered 2022-11-13 – 2022-11-15 (×3): 1 via ORAL
  Filled 2022-11-13 (×3): qty 1

## 2022-11-13 MED ORDER — SODIUM CHLORIDE 0.9 % IV SOLN
2.0000 g | Freq: Once | INTRAVENOUS | Status: AC
  Administered 2022-11-13: 2 g via INTRAVENOUS
  Filled 2022-11-13: qty 12.5

## 2022-11-13 MED ORDER — ENOXAPARIN SODIUM 40 MG/0.4ML IJ SOSY
40.0000 mg | PREFILLED_SYRINGE | INTRAMUSCULAR | Status: DC
  Administered 2022-11-14: 40 mg via SUBCUTANEOUS
  Filled 2022-11-13: qty 0.4

## 2022-11-13 MED ORDER — VANCOMYCIN HCL 1500 MG/300ML IV SOLN: 1500.0000 mg | INTRAVENOUS | Status: DC

## 2022-11-13 MED ORDER — IOHEXOL 350 MG/ML SOLN
75.0000 mL | Freq: Once | INTRAVENOUS | Status: AC | PRN
  Administered 2022-11-13: 75 mL via INTRAVENOUS

## 2022-11-13 MED ORDER — ONDANSETRON HCL 4 MG/2ML IJ SOLN
4.0000 mg | Freq: Once | INTRAMUSCULAR | Status: AC
  Administered 2022-11-13: 4 mg via INTRAVENOUS
  Filled 2022-11-13: qty 2

## 2022-11-13 MED ORDER — DIAZEPAM 5 MG/ML IJ SOLN
5.0000 mg | Freq: Once | INTRAMUSCULAR | Status: AC
  Administered 2022-11-13: 5 mg via INTRAVENOUS
  Filled 2022-11-13: qty 2

## 2022-11-13 MED ORDER — INSULIN ASPART 100 UNIT/ML IJ SOLN
0.0000 [IU] | INTRAMUSCULAR | Status: DC
  Administered 2022-11-13: 5 [IU] via SUBCUTANEOUS
  Administered 2022-11-14 (×2): 2 [IU] via SUBCUTANEOUS

## 2022-11-13 MED ORDER — LORAZEPAM 2 MG/ML IJ SOLN: 0.0000 mg | Freq: Four times a day (QID) | INTRAMUSCULAR | Status: DC

## 2022-11-13 MED ORDER — LACTATED RINGERS IV SOLN: INTRAVENOUS | Status: AC

## 2022-11-13 NOTE — ED Notes (Signed)
ED TO INPATIENT HANDOFF REPORT  ED Nurse Name and Phone #:  [phone]  S Name/Age/Gender Clayton Kennedy 62 y.o. male Room/Bed: 018C/018C  Code Status   Code Status: Not on file  Home/SNF/Other Home Patient oriented to: self, place, time, and situation Is this baseline? Yes   Triage Complete: Triage complete  Chief Complaint Alcohol withdrawal ([place]) [F10.939]  Triage Note Pt c/o centralized sharp chest pain, cough, and n/v for approx 2 days. Denies sick contacts.     Allergies No Known Allergies  Level of Care/Admitting Diagnosis ED Disposition     ED Disposition  Admit   Condition  --   Comment  Hospital Area: [facility] [[id number]]  Level of Care: Progressive [102]  Admit to Progressive based on following criteria: ACUTE MENTAL DISORDER-RELATED Drug/Alcohol Ingestion/Overdose/Withdrawal, Suicidal Ideation/attempt requiring safety sitter and < Q2h monitoring/assessments, moderate to severe agitation that is managed with medication/sitter, CIWA-Ar score < 20.  May place patient in observation at [facility] or [place] if equivalent level of care is available:: No  Covid Evaluation: Asymptomatic - no recent exposure (last 10 days) testing not required  Diagnosis: Alcohol withdrawal ([place]) [291.81.ICD-9-CM]  Admitting Physician: Eppie Gibson [id number]  Attending Physician: Lenoria Chime [id number]          B Medical/Surgery History Past Medical History:  Diagnosis Date   Diverticulitis    Gout    Past Surgical History:  Procedure Laterality Date   COLON SURGERY     Partial colectomy for diverticulitis   ORIF ULNAR FRACTURE       A IV Location/Drains/Wounds Patient Lines/Drains/Airways Status     Active Line/Drains/Airways     Name Placement date Placement time Site Days   Peripheral IV 11/13/22 20 G Left Antecubital 11/13/22  1802  Antecubital  less than 1   Peripheral IV 11/13/22 20 G Right Antecubital 11/13/22  1807   Antecubital  less than 1            Intake/Output Last 24 hours  Intake/Output Summary (Last 24 hours) at 11/13/2022 2126 Last data filed at 11/13/2022 2054 Gross per 24 hour  Intake 2791.67 ml  Output 200 ml  Net 2591.67 ml    Labs/Imaging Results for orders placed or performed during the hospital encounter of 11/13/22 (from the past 48 hour(s))  Resp panel by RT-PCR (RSV, Flu A&B, Covid) Anterior Nasal Swab     Status: Abnormal   Collection Time: 11/13/22  4:06 PM   Specimen: Anterior Nasal Swab  Result Value Ref Range   SARS Coronavirus 2 by RT PCR POSITIVE (A) NEGATIVE   Influenza A by PCR NEGATIVE NEGATIVE   Influenza B by PCR NEGATIVE NEGATIVE    Comment: (NOTE) The Xpert Xpress SARS-CoV-2/FLU/RSV plus assay is intended as an aid in the diagnosis of influenza from Nasopharyngeal swab specimens and should not be used as a sole basis for treatment. Nasal washings and aspirates are unacceptable for Xpert Xpress SARS-CoV-2/FLU/RSV testing.  Fact Sheet for Patients: EntrepreneurPulse.com.au  Fact Sheet for Healthcare Providers: IncredibleEmployment.be  This test is not yet approved or cleared by the [place] FDA and has been authorized for detection and/or diagnosis of SARS-CoV-2 by FDA under an Emergency Use Authorization (EUA). This EUA will remain in effect (meaning this test can be used) for the duration of the COVID-19 declaration under Section 564(b)(1) of the Act, 21 U.S.C. section 360bbb-3(b)(1), unless the authorization is terminated or revoked.     Resp Syncytial Virus by  PCR NEGATIVE NEGATIVE    Comment: (NOTE) Fact Sheet for Patients: EntrepreneurPulse.com.au  Fact Sheet for Healthcare Providers: IncredibleEmployment.be  This test is not yet approved or cleared by the [place] FDA and has been authorized for detection and/or diagnosis of SARS-CoV-2 by FDA under an  Emergency Use Authorization (EUA). This EUA will remain in effect (meaning this test can be used) for the duration of the COVID-19 declaration under Section 564(b)(1) of the Act, 21 U.S.C. section 360bbb-3(b)(1), unless the authorization is terminated or revoked.  Performed at [facility], [place] [address] [place], [place] [address]   Lactic acid, plasma     Status: Abnormal   Collection Time: 11/13/22  4:21 PM  Result Value Ref Range   Lactic Acid, Venous 6.0 (HH) 0.5 - 1.9 mmol/L    Comment: CRITICAL RESULT CALLED TO, READ BACK BY AND VERIFIED WITH Judd Gaudier, RN @ [phone] 11/13/22 BY SEKDAHL. Performed at [facility], [place] [address], [place], [place] [address]   CBC with Differential     Status: Abnormal   Collection Time: 11/13/22  4:23 PM  Result Value Ref Range   WBC 8.2 4.0 - 10.5 K/uL   RBC 5.81 4.22 - 5.81 MIL/uL   Hemoglobin 17.9 (H) 13.0 - 17.0 g/dL   HCT 52.8 (H) 39.0 - 52.0 %   MCV 90.9 80.0 - 100.0 fL   MCH 30.8 26.0 - 34.0 pg   MCHC 33.9 30.0 - 36.0 g/dL   RDW 12.9 11.5 - 15.5 %   Platelets 111 (L) 150 - 400 K/uL    Comment: REPEATED TO VERIFY   nRBC 0.0 0.0 - 0.2 %   Neutrophils Relative % 82 %   Neutro Abs 6.8 1.7 - 7.7 K/uL   Lymphocytes Relative 6 %   Lymphs Abs 0.5 (L) 0.7 - 4.0 K/uL   Monocytes Relative 8 %   Monocytes Absolute 0.7 0.1 - 1.0 K/uL   Eosinophils Relative 3 %   Eosinophils Absolute 0.3 0.0 - 0.5 K/uL   Basophils Relative 1 %   Basophils Absolute 0.0 0.0 - 0.1 K/uL   WBC Morphology MORPHOLOGY UNREMARKABLE    RBC Morphology MORPHOLOGY UNREMARKABLE    Smear Review MORPHOLOGY UNREMARKABLE    Immature Granulocytes 0 %   Abs Immature Granulocytes 0.02 0.00 - 0.07 K/uL    Comment: Performed at [facility], [place] [address] [place], [place] [address]  Comprehensive metabolic panel     Status: Abnormal   Collection Time: 11/13/22  4:23 PM  Result Value Ref Range   Sodium 135 135 - 145 mmol/L   Potassium 4.1 3.5 - 5.1 mmol/L    Chloride 89 (L) 98 - 111 mmol/L   CO2 17 (L) 22 - 32 mmol/L   Glucose, Bld 263 (H) 70 - 99 mg/dL    Comment: Glucose reference range applies only to samples taken after fasting for at least 8 hours.   BUN 18 6 - 20 mg/dL   Creatinine, Ser 1.58 (H) 0.61 - 1.24 mg/dL   Calcium 10.2 8.9 - 10.3 mg/dL   Total Protein 8.8 (H) 6.5 - 8.1 g/dL   Albumin 5.2 (H) 3.5 - 5.0 g/dL   AST 65 (H) 15 - 41 U/L   ALT 56 (H) 0 - 44 U/L   Alkaline Phosphatase 64 38 - 126 U/L   Total Bilirubin 2.4 (H) 0.3 - 1.2 mg/dL   GFR, Estimated 50 (L) >60 mL/min    Comment: (NOTE)  Calculated using the CKD-EPI Creatinine Equation (2021)    Anion gap 29 (H) 5 - 15    Comment: ELECTROLYTES REPEATED TO VERIFY Performed at [facility], [place] [address], [place], [place] [address]   Troponin I (High Sensitivity)     Status: None   Collection Time: 11/13/22  4:23 PM  Result Value Ref Range   Troponin I (High Sensitivity) 15 <18 ng/L    Comment: (NOTE) Elevated high sensitivity troponin I (hsTnI) values and significant  changes across serial measurements may suggest ACS but many other  chronic and acute conditions are known to elevate hsTnI results.  Refer to the "Links" section for chest pain algorithms and additional  guidance. Performed at [facility], [place] [address] [place], [place] [address]   Lipase, blood     Status: Abnormal   Collection Time: 11/13/22  4:23 PM  Result Value Ref Range   Lipase 67 (H) 11 - 51 U/L    Comment: Performed at [place] [address], [place], [place] [address]  Lactic acid, plasma     Status: Abnormal   Collection Time: 11/13/22  6:02 PM  Result Value Ref Range   Lactic Acid, Venous 5.4 (HH) 0.5 - 1.9 mmol/L    Comment: CRITICAL VALUE NOTED. VALUE IS CONSISTENT WITH PREVIOUSLY REPORTED/CALLED VALUE Performed at [facility], [place] [address], [place], [place] [address]   Ethanol     Status: None   Collection Time: 11/13/22  6:02 PM  Result Value  Ref Range   Alcohol, Ethyl (B) <10 <10 mg/dL    Comment: (NOTE) Lowest detectable limit for serum alcohol is 10 mg/dL.  For medical purposes only. Performed at [facility], [place] [address] [place], [place] [address]   Urinalysis, Routine w reflex microscopic -Urine, Clean Catch     Status: Abnormal   Collection Time: 11/13/22  6:03 PM  Result Value Ref Range   Color, Urine AMBER (A) YELLOW    Comment: BIOCHEMICALS MAY BE AFFECTED BY COLOR   APPearance HAZY (A) CLEAR   Specific Gravity, Urine 1.029 1.005 - 1.030   pH 5.0 5.0 - 8.0   Glucose, UA >=500 (A) NEGATIVE mg/dL   Hgb urine dipstick MODERATE (A) NEGATIVE   Bilirubin Urine SMALL (A) NEGATIVE   Ketones, ur 80 (A) NEGATIVE mg/dL   Protein, ur >=300 (A) NEGATIVE mg/dL   Nitrite NEGATIVE NEGATIVE   Leukocytes,Ua NEGATIVE NEGATIVE   RBC / HPF 11-20 0 - 5 RBC/hpf   WBC, UA 0-5 0 - 5 WBC/hpf   Bacteria, UA RARE (A) NONE SEEN   Squamous Epithelial / HPF 6-10 0 - 5 /HPF   Mucus PRESENT    Hyaline Casts, UA PRESENT     Comment: Performed at [facility], [address] [place], [place] [address]  Troponin I (High Sensitivity)     Status: None   Collection Time: 11/13/22  6:06 PM  Result Value Ref Range   Troponin I (High Sensitivity) 15 <18 ng/L    Comment: (NOTE) Elevated high sensitivity troponin I (hsTnI) values and significant  changes across serial measurements may suggest ACS but many other  chronic and acute conditions are known to elevate hsTnI results.  Refer to the "Links" section for chest pain algorithms and additional  guidance. Performed at [facility], [place] [address], [place], [place] [address]   I-Stat venous blood gas, [facility], [facility], [facility])  Status: Abnormal   Collection Time: 11/13/22  6:14 PM  Result Value Ref Range   pH, Ven 7.503 (H) 7.25 - 7.43   pCO2, Ven 32.1 (L) 44 - 60 mmHg   pO2, Ven 30 (LL) 32 - 45 mmHg   Bicarbonate 25.2 20.0 - 28.0 mmol/L   TCO2 26 22 - 32 mmol/L   O2  Saturation 65 %   Acid-Base Excess 3.0 (H) 0.0 - 2.0 mmol/L   Sodium 133 (L) 135 - 145 mmol/L   Potassium 3.8 3.5 - 5.1 mmol/L   Calcium, Ion 1.05 (L) 1.15 - 1.40 mmol/L   HCT 57.0 (H) 39.0 - 52.0 %   Hemoglobin 19.4 (H) 13.0 - 17.0 g/dL   Sample type VENOUS    Comment NOTIFIED PHYSICIAN    CT Angio Chest PE W/Cm &/Or Wo Cm  Result Date: 11/13/2022 CLINICAL DATA:  Chest pain and cough for 2 days with abdominal pain, initial encounter EXAM: CT ANGIOGRAPHY CHEST CT ABDOMEN AND PELVIS WITH CONTRAST TECHNIQUE: Multidetector CT imaging of the chest was performed using the standard protocol during bolus administration of intravenous contrast. Multiplanar CT image reconstructions and MIPs were obtained to evaluate the vascular anatomy. Multidetector CT imaging of the abdomen and pelvis was performed using the standard protocol during bolus administration of intravenous contrast. RADIATION DOSE REDUCTION: This exam was performed according to the departmental dose-optimization program which includes automated exposure control, adjustment of the mA and/or kV according to patient size and/or use of iterative reconstruction technique. CONTRAST:  59m OMNIPAQUE IOHEXOL 350 MG/ML SOLN COMPARISON:  Chest x-ray from earlier in the same day. FINDINGS: CTA CHEST FINDINGS Cardiovascular: Thoracic aorta demonstrates atherosclerotic calcifications without aneurysmal dilatation or dissection. No cardiac enlargement is noted. No significant coronary calcifications are seen. The pulmonary artery shows a normal branching pattern bilaterally. No focal filling defect to suggest pulmonary embolism is noted. Mediastinum/Nodes: The esophagus demonstrates some mild wall thickening which may be related to reflux and reflux esophagitis. No hilar or mediastinal adenopathy is noted. The thoracic inlet is within normal limits. Lungs/Pleura: The lungs are well aerated bilaterally. No focal infiltrate or sizable effusion is seen. No  parenchymal nodules are seen. Musculoskeletal: No acute rib abnormality is noted. No compression deformity is noted. Review of the MIP images confirms the above findings. CT ABDOMEN and PELVIS FINDINGS Hepatobiliary: Fatty infiltration of the liver is noted. The gallbladder is well distended without acute abnormality. Pancreas: Unremarkable. No pancreatic ductal dilatation or surrounding inflammatory changes. Spleen: Normal in size without focal abnormality. Adrenals/Urinary Tract: The adrenal glands are within normal limits. Kidneys demonstrate a normal enhancement pattern bilaterally. No renal calculi or obstructive changes are noted. The bladder is partially distended. Stomach/Bowel: Postsurgical changes are noted in the descending colon with primary nasty most CIS. No obstructive or inflammatory changes of colon are seen. The appendix is within normal limits. Multiple surgical staples are noted in the left abdomen consistent with the prior surgery. Prior small bowel surgery is noted as well. No obstructive changes are seen. Stomach is distended with fluid. Vascular/Lymphatic: Aortic atherosclerosis. No enlarged abdominal or pelvic lymph nodes. Reproductive: Prostate is unremarkable. Other: No abdominal wall hernia or abnormality. No abdominopelvic ascites. Musculoskeletal: Degenerative changes of lumbar spine are noted. Review of the MIP images confirms the above findings. IMPRESSION: CT of the chest: No evidence of pulmonary emboli. Mild distal esophageal thickening which may be related to reflux esophagitis. CT of the abdomen and pelvis: Fatty infiltration of the liver. Postsurgical changes in the  small bowel and stomach without acute abnormality. Electronically Signed   By: Inez Catalina M.D.   On: 11/13/2022 19:42   CT ABDOMEN PELVIS W CONTRAST  Result Date: 11/13/2022 CLINICAL DATA:  Chest pain and cough for 2 days with abdominal pain, initial encounter EXAM: CT ANGIOGRAPHY CHEST CT ABDOMEN AND PELVIS  WITH CONTRAST TECHNIQUE: Multidetector CT imaging of the chest was performed using the standard protocol during bolus administration of intravenous contrast. Multiplanar CT image reconstructions and MIPs were obtained to evaluate the vascular anatomy. Multidetector CT imaging of the abdomen and pelvis was performed using the standard protocol during bolus administration of intravenous contrast. RADIATION DOSE REDUCTION: This exam was performed according to the departmental dose-optimization program which includes automated exposure control, adjustment of the mA and/or kV according to patient size and/or use of iterative reconstruction technique. CONTRAST:  9m OMNIPAQUE IOHEXOL 350 MG/ML SOLN COMPARISON:  Chest x-ray from earlier in the same day. FINDINGS: CTA CHEST FINDINGS Cardiovascular: Thoracic aorta demonstrates atherosclerotic calcifications without aneurysmal dilatation or dissection. No cardiac enlargement is noted. No significant coronary calcifications are seen. The pulmonary artery shows a normal branching pattern bilaterally. No focal filling defect to suggest pulmonary embolism is noted. Mediastinum/Nodes: The esophagus demonstrates some mild wall thickening which may be related to reflux and reflux esophagitis. No hilar or mediastinal adenopathy is noted. The thoracic inlet is within normal limits. Lungs/Pleura: The lungs are well aerated bilaterally. No focal infiltrate or sizable effusion is seen. No parenchymal nodules are seen. Musculoskeletal: No acute rib abnormality is noted. No compression deformity is noted. Review of the MIP images confirms the above findings. CT ABDOMEN and PELVIS FINDINGS Hepatobiliary: Fatty infiltration of the liver is noted. The gallbladder is well distended without acute abnormality. Pancreas: Unremarkable. No pancreatic ductal dilatation or surrounding inflammatory changes. Spleen: Normal in size without focal abnormality. Adrenals/Urinary Tract: The adrenal glands  are within normal limits. Kidneys demonstrate a normal enhancement pattern bilaterally. No renal calculi or obstructive changes are noted. The bladder is partially distended. Stomach/Bowel: Postsurgical changes are noted in the descending colon with primary nasty most CIS. No obstructive or inflammatory changes of colon are seen. The appendix is within normal limits. Multiple surgical staples are noted in the left abdomen consistent with the prior surgery. Prior small bowel surgery is noted as well. No obstructive changes are seen. Stomach is distended with fluid. Vascular/Lymphatic: Aortic atherosclerosis. No enlarged abdominal or pelvic lymph nodes. Reproductive: Prostate is unremarkable. Other: No abdominal wall hernia or abnormality. No abdominopelvic ascites. Musculoskeletal: Degenerative changes of lumbar spine are noted. Review of the MIP images confirms the above findings. IMPRESSION: CT of the chest: No evidence of pulmonary emboli. Mild distal esophageal thickening which may be related to reflux esophagitis. CT of the abdomen and pelvis: Fatty infiltration of the liver. Postsurgical changes in the small bowel and stomach without acute abnormality. Electronically Signed   By: MInez CatalinaM.D.   On: 11/13/2022 19:42   CT Head Wo Contrast  Result Date: 11/13/2022 CLINICAL DATA:  Chest pain and nausea and vomiting.  Recent COVID. EXAM: CT HEAD WITHOUT CONTRAST TECHNIQUE: Contiguous axial images were obtained from the base of the skull through the vertex without intravenous contrast. RADIATION DOSE REDUCTION: This exam was performed according to the departmental dose-optimization program which includes automated exposure control, adjustment of the mA and/or kV according to patient size and/or use of iterative reconstruction technique. COMPARISON:  None Available. FINDINGS: Brain: No evidence of acute infarction, hemorrhage, hydrocephalus, extra-axial collection  or mass lesion/mass effect. Vascular:  Atherosclerotic vascular calcification of the carotid siphons. No hyperdense vessel. Skull: Normal. Negative for fracture or focal lesion. Sinuses/Orbits: No acute finding. Other: None. IMPRESSION: 1. No acute intracranial abnormality. Electronically Signed   By: Titus Dubin M.D.   On: 11/13/2022 19:41   DG Chest 2 View  Result Date: 11/13/2022 CLINICAL DATA:  Chest pain. EXAM: CHEST - 2 VIEW COMPARISON:  August 03, 2022. FINDINGS: The heart size and mediastinal contours are within normal limits. Both lungs are clear. The visualized skeletal structures are unremarkable. IMPRESSION: No active cardiopulmonary disease. Electronically Signed   By: Marijo Conception M.D.   On: 11/13/2022 16:55    Pending Labs Unresulted Labs (From admission, onward)     Start     Ordered   11/13/22 2053  Beta-hydroxybutyric acid  ONCE - URGENT,   URGENT        11/13/22 2052   11/13/22 1749  Blood culture (routine x 2)  BLOOD CULTURE X 2,   R (with STAT occurrences)      11/13/22 1752            Vitals/Pain Today's Vitals   11/13/22 2000 11/13/22 2000 11/13/22 2054 11/13/22 2103  BP: (!) 173/128 (!) 173/128 (!) 161/110   Pulse: (!) 111 (!) 101 (!) 111   Resp:      Temp:    98.5 F (36.9 C)  TempSrc:    Oral  SpO2: 98%     Weight:      Height:      PainSc:        Isolation Precautions No active isolations  Medications Medications  lactated ringers infusion ( Intravenous New Bag/Given 11/13/22 2019)  thiamine (VITAMIN B1) tablet 100 mg (100 mg Oral Given 11/13/22 1942)    Or  thiamine (VITAMIN B1) injection 100 mg ( Intravenous See Alternative 11/13/22 1942)  LORazepam (ATIVAN) tablet 1-4 mg (has no administration in time range)    Or  LORazepam (ATIVAN) injection 1-4 mg (has no administration in time range)  folic acid (FOLVITE) tablet 1 mg (has no administration in time range)  multivitamin with minerals tablet 1 tablet (has no administration in time range)  ondansetron (ZOFRAN) injection 4  mg (4 mg Intravenous Given 11/13/22 1831)  lactated ringers bolus 1,000 mL (0 mLs Intravenous Stopped 11/13/22 2034)    And  lactated ringers bolus 1,000 mL (0 mLs Intravenous Stopped 11/13/22 2033)    And  lactated ringers bolus 500 mL (0 mLs Intravenous Stopped 11/13/22 2035)  ceFEPIme (MAXIPIME) 2 g in sodium chloride 0.9 % 100 mL IVPB (0 g Intravenous Stopped 11/13/22 2034)  diazepam (VALIUM) injection 5 mg (5 mg Intravenous Given 11/13/22 1829)  iohexol (OMNIPAQUE) 350 MG/ML injection 75 mL (75 mLs Intravenous Contrast Given 11/13/22 1932)  LORazepam (ATIVAN) tablet 1 mg (1 mg Oral Given 11/13/22 2059)    Mobility walks     Focused Assessments Alcohol withdrawl   R Recommendations: See Admitting Provider Note  Report given to:   Additional Notes:  Last CIWA 11 to which oral ativan was given. Covid +

## 2022-11-13 NOTE — ED Provider Notes (Signed)
[facility] Provider Note   CSN: [id number] Arrival date & time: 11/13/22  1528     History  Chief Complaint  Patient presents with   Chest Pain    Clayton Kennedy is a 62 y.o. male.  Patient presents the emergency room complaining of chest pain, shortness of breath, nausea, and vomiting for the past 2 days.  He denies any known sick contacts.  He states that he was recently diagnosed with COVID and completed Paxlovid but has continued to feel unwell.  The patient denies abdominal pain, urinary symptoms, headache at this time.  The patient does have a history of heavy alcohol usage, typically drinking a sixpack of beer daily.  He states he stopped drinking 2 days ago.  He appears tremulous upon arrival.  He is tachycardic.  Past medical history includes alcohol dependence, gastritis, diverticulitis, type II DM  HPI     Home Medications Prior to Admission medications   Medication Sig Start Date End Date Taking? Authorizing Provider  atorvastatin (LIPITOR) 40 MG tablet Take 1 tablet (40 mg total) by mouth daily. 07/29/18   Glenis Smoker, MD  famotidine (PEPCID) 20 MG tablet Take 1 tablet (20 mg total) by mouth 2 (two) times daily. 08/03/22   Audley Hose, MD  gabapentin (NEURONTIN) 300 MG capsule Take 2 capsules (600 mg total) by mouth at bedtime. 07/29/18   Glenis Smoker, MD  ketoconazole (NIZORAL) 2 % shampoo Apply 1 application topically 2 (two) times a week. 02/17/18   Glenis Smoker, MD  losartan (COZAAR) 100 MG tablet Take 1 tablet (100 mg total) by mouth daily. 07/29/18   Glenis Smoker, MD  metFORMIN (GLUCOPHAGE) 500 MG tablet Take 1 tablet (500 mg total) 2 (two) times daily with a meal by mouth. 07/29/17   Glenis Smoker, MD  naproxen (NAPROSYN) 500 MG tablet Take 1 tablet (500 mg total) by mouth 2 (two) times daily as needed. As needed for gout flares 02/14/18   Glenis Smoker, MD       Allergies    Patient has no known allergies.    Review of Systems   Review of Systems  Constitutional:  Negative for fever.  Respiratory:  Positive for shortness of breath.   Cardiovascular:  Positive for chest pain.  Gastrointestinal:  Positive for nausea and vomiting. Negative for abdominal pain.  Neurological:  Positive for tremors.    Physical Exam Updated Vital Signs BP (!) 161/110   Pulse (!) 111   Temp 98.5 F (36.9 C) (Oral)   Resp 18   Ht '5\' 11"'$  (1.803 m)   Wt 77.1 kg   SpO2 98%   BMI 23.71 kg/m  Physical Exam Vitals and nursing note reviewed.  Constitutional:      General: He is in acute distress.     Appearance: He is well-developed. He is ill-appearing and diaphoretic.  HENT:     Head: Normocephalic and atraumatic.  Eyes:     Extraocular Movements: Extraocular movements intact.     Conjunctiva/sclera: Conjunctivae normal.     Pupils: Pupils are equal, round, and reactive to light.  Cardiovascular:     Rate and Rhythm: Regular rhythm. Tachycardia present.     Heart sounds: No murmur heard. Pulmonary:     Effort: Pulmonary effort is normal. No respiratory distress.     Breath sounds: Normal breath sounds.  Abdominal:     Palpations: Abdomen is soft.  Tenderness: There is no abdominal tenderness.  Musculoskeletal:        General: No swelling.     Cervical back: Neck supple.     Comments: Tremors  Skin:    General: Skin is warm.     Capillary Refill: Capillary refill takes less than 2 seconds.  Neurological:     Mental Status: He is alert.  Psychiatric:        Mood and Affect: Mood normal.     ED Results / Procedures / Treatments   Labs (all labs ordered are listed, but only abnormal results are displayed) Labs Reviewed  RESP PANEL BY RT-PCR (RSV, FLU A&B, COVID)  RVPGX2 - Abnormal; Notable for the following components:      Result Value   SARS Coronavirus 2 by RT PCR POSITIVE (*)    All other components within normal limits  CBC WITH  DIFFERENTIAL/PLATELET - Abnormal; Notable for the following components:   Hemoglobin 17.9 (*)    HCT 52.8 (*)    Platelets 111 (*)    Lymphs Abs 0.5 (*)    All other components within normal limits  COMPREHENSIVE METABOLIC PANEL - Abnormal; Notable for the following components:   Chloride 89 (*)    CO2 17 (*)    Glucose, Bld 263 (*)    Creatinine, Ser 1.58 (*)    Total Protein 8.8 (*)    Albumin 5.2 (*)    AST 65 (*)    ALT 56 (*)    Total Bilirubin 2.4 (*)    GFR, Estimated 50 (*)    Anion gap 29 (*)    All other components within normal limits  LIPASE, BLOOD - Abnormal; Notable for the following components:   Lipase 67 (*)    All other components within normal limits  URINALYSIS, ROUTINE W REFLEX MICROSCOPIC - Abnormal; Notable for the following components:   Color, Urine AMBER (*)    APPearance HAZY (*)    Glucose, UA >=500 (*)    Hgb urine dipstick MODERATE (*)    Bilirubin Urine SMALL (*)    Ketones, ur 80 (*)    Protein, ur >=300 (*)    Bacteria, UA RARE (*)    All other components within normal limits  LACTIC ACID, PLASMA - Abnormal; Notable for the following components:   Lactic Acid, Venous 6.0 (*)    All other components within normal limits  LACTIC ACID, PLASMA - Abnormal; Notable for the following components:   Lactic Acid, Venous 5.4 (*)    All other components within normal limits  I-STAT VENOUS BLOOD GAS, ED - Abnormal; Notable for the following components:   pH, Ven 7.503 (*)    pCO2, Ven 32.1 (*)    pO2, Ven 30 (*)    Acid-Base Excess 3.0 (*)    Sodium 133 (*)    Calcium, Ion 1.05 (*)    HCT 57.0 (*)    Hemoglobin 19.4 (*)    All other components within normal limits  CULTURE, BLOOD (ROUTINE X 2)  CULTURE, BLOOD (ROUTINE X 2)  ETHANOL  BETA-HYDROXYBUTYRIC ACID  TROPONIN I (HIGH SENSITIVITY)  TROPONIN I (HIGH SENSITIVITY)    EKG EKG Interpretation  Date/Time:  Tuesday November 13 2022 15:50:42 EST Ventricular Rate:  121 PR Interval:  152 QRS  Duration: 74 QT Interval:  332 QTC Calculation: 471 R Axis:   93 Text Interpretation: Sinus tachycardia Rightward axis  rate is faster compared to Nov 2023 Confirmed by Sherwood Gambler [phone]) on  11/13/2022 6:09:59 PM  Radiology CT Angio Chest PE W/Cm &/Or Wo Cm  Result Date: 11/13/2022 CLINICAL DATA:  Chest pain and cough for 2 days with abdominal pain, initial encounter EXAM: CT ANGIOGRAPHY CHEST CT ABDOMEN AND PELVIS WITH CONTRAST TECHNIQUE: Multidetector CT imaging of the chest was performed using the standard protocol during bolus administration of intravenous contrast. Multiplanar CT image reconstructions and MIPs were obtained to evaluate the vascular anatomy. Multidetector CT imaging of the abdomen and pelvis was performed using the standard protocol during bolus administration of intravenous contrast. RADIATION DOSE REDUCTION: This exam was performed according to the departmental dose-optimization program which includes automated exposure control, adjustment of the mA and/or kV according to patient size and/or use of iterative reconstruction technique. CONTRAST:  29m OMNIPAQUE IOHEXOL 350 MG/ML SOLN COMPARISON:  Chest x-ray from earlier in the same day. FINDINGS: CTA CHEST FINDINGS Cardiovascular: Thoracic aorta demonstrates atherosclerotic calcifications without aneurysmal dilatation or dissection. No cardiac enlargement is noted. No significant coronary calcifications are seen. The pulmonary artery shows a normal branching pattern bilaterally. No focal filling defect to suggest pulmonary embolism is noted. Mediastinum/Nodes: The esophagus demonstrates some mild wall thickening which may be related to reflux and reflux esophagitis. No hilar or mediastinal adenopathy is noted. The thoracic inlet is within normal limits. Lungs/Pleura: The lungs are well aerated bilaterally. No focal infiltrate or sizable effusion is seen. No parenchymal nodules are seen. Musculoskeletal: No acute rib abnormality is  noted. No compression deformity is noted. Review of the MIP images confirms the above findings. CT ABDOMEN and PELVIS FINDINGS Hepatobiliary: Fatty infiltration of the liver is noted. The gallbladder is well distended without acute abnormality. Pancreas: Unremarkable. No pancreatic ductal dilatation or surrounding inflammatory changes. Spleen: Normal in size without focal abnormality. Adrenals/Urinary Tract: The adrenal glands are within normal limits. Kidneys demonstrate a normal enhancement pattern bilaterally. No renal calculi or obstructive changes are noted. The bladder is partially distended. Stomach/Bowel: Postsurgical changes are noted in the descending colon with primary nasty most CIS. No obstructive or inflammatory changes of colon are seen. The appendix is within normal limits. Multiple surgical staples are noted in the left abdomen consistent with the prior surgery. Prior small bowel surgery is noted as well. No obstructive changes are seen. Stomach is distended with fluid. Vascular/Lymphatic: Aortic atherosclerosis. No enlarged abdominal or pelvic lymph nodes. Reproductive: Prostate is unremarkable. Other: No abdominal wall hernia or abnormality. No abdominopelvic ascites. Musculoskeletal: Degenerative changes of lumbar spine are noted. Review of the MIP images confirms the above findings. IMPRESSION: CT of the chest: No evidence of pulmonary emboli. Mild distal esophageal thickening which may be related to reflux esophagitis. CT of the abdomen and pelvis: Fatty infiltration of the liver. Postsurgical changes in the small bowel and stomach without acute abnormality. Electronically Signed   By: MInez CatalinaM.D.   On: 11/13/2022 19:42   CT ABDOMEN PELVIS W CONTRAST  Result Date: 11/13/2022 CLINICAL DATA:  Chest pain and cough for 2 days with abdominal pain, initial encounter EXAM: CT ANGIOGRAPHY CHEST CT ABDOMEN AND PELVIS WITH CONTRAST TECHNIQUE: Multidetector CT imaging of the chest was performed  using the standard protocol during bolus administration of intravenous contrast. Multiplanar CT image reconstructions and MIPs were obtained to evaluate the vascular anatomy. Multidetector CT imaging of the abdomen and pelvis was performed using the standard protocol during bolus administration of intravenous contrast. RADIATION DOSE REDUCTION: This exam was performed according to the departmental dose-optimization program which includes automated exposure control, adjustment of the  mA and/or kV according to patient size and/or use of iterative reconstruction technique. CONTRAST:  20m OMNIPAQUE IOHEXOL 350 MG/ML SOLN COMPARISON:  Chest x-ray from earlier in the same day. FINDINGS: CTA CHEST FINDINGS Cardiovascular: Thoracic aorta demonstrates atherosclerotic calcifications without aneurysmal dilatation or dissection. No cardiac enlargement is noted. No significant coronary calcifications are seen. The pulmonary artery shows a normal branching pattern bilaterally. No focal filling defect to suggest pulmonary embolism is noted. Mediastinum/Nodes: The esophagus demonstrates some mild wall thickening which may be related to reflux and reflux esophagitis. No hilar or mediastinal adenopathy is noted. The thoracic inlet is within normal limits. Lungs/Pleura: The lungs are well aerated bilaterally. No focal infiltrate or sizable effusion is seen. No parenchymal nodules are seen. Musculoskeletal: No acute rib abnormality is noted. No compression deformity is noted. Review of the MIP images confirms the above findings. CT ABDOMEN and PELVIS FINDINGS Hepatobiliary: Fatty infiltration of the liver is noted. The gallbladder is well distended without acute abnormality. Pancreas: Unremarkable. No pancreatic ductal dilatation or surrounding inflammatory changes. Spleen: Normal in size without focal abnormality. Adrenals/Urinary Tract: The adrenal glands are within normal limits. Kidneys demonstrate a normal enhancement pattern  bilaterally. No renal calculi or obstructive changes are noted. The bladder is partially distended. Stomach/Bowel: Postsurgical changes are noted in the descending colon with primary nasty most CIS. No obstructive or inflammatory changes of colon are seen. The appendix is within normal limits. Multiple surgical staples are noted in the left abdomen consistent with the prior surgery. Prior small bowel surgery is noted as well. No obstructive changes are seen. Stomach is distended with fluid. Vascular/Lymphatic: Aortic atherosclerosis. No enlarged abdominal or pelvic lymph nodes. Reproductive: Prostate is unremarkable. Other: No abdominal wall hernia or abnormality. No abdominopelvic ascites. Musculoskeletal: Degenerative changes of lumbar spine are noted. Review of the MIP images confirms the above findings. IMPRESSION: CT of the chest: No evidence of pulmonary emboli. Mild distal esophageal thickening which may be related to reflux esophagitis. CT of the abdomen and pelvis: Fatty infiltration of the liver. Postsurgical changes in the small bowel and stomach without acute abnormality. Electronically Signed   By: MInez CatalinaM.D.   On: 11/13/2022 19:42   CT Head Wo Contrast  Result Date: 11/13/2022 CLINICAL DATA:  Chest pain and nausea and vomiting.  Recent COVID. EXAM: CT HEAD WITHOUT CONTRAST TECHNIQUE: Contiguous axial images were obtained from the base of the skull through the vertex without intravenous contrast. RADIATION DOSE REDUCTION: This exam was performed according to the departmental dose-optimization program which includes automated exposure control, adjustment of the mA and/or kV according to patient size and/or use of iterative reconstruction technique. COMPARISON:  None Available. FINDINGS: Brain: No evidence of acute infarction, hemorrhage, hydrocephalus, extra-axial collection or mass lesion/mass effect. Vascular: Atherosclerotic vascular calcification of the carotid siphons. No hyperdense  vessel. Skull: Normal. Negative for fracture or focal lesion. Sinuses/Orbits: No acute finding. Other: None. IMPRESSION: 1. No acute intracranial abnormality. Electronically Signed   By: WTitus DubinM.D.   On: 11/13/2022 19:41   DG Chest 2 View  Result Date: 11/13/2022 CLINICAL DATA:  Chest pain. EXAM: CHEST - 2 VIEW COMPARISON:  August 03, 2022. FINDINGS: The heart size and mediastinal contours are within normal limits. Both lungs are clear. The visualized skeletal structures are unremarkable. IMPRESSION: No active cardiopulmonary disease. Electronically Signed   By: JMarijo ConceptionM.D.   On: 11/13/2022 16:55    Procedures .Critical Care  Performed by: MDorothyann Peng PA-C Authorized by:  Dorothyann Peng, PA-C   Critical care provider statement:    Critical care time (minutes):  30   Critical care was necessary to treat or prevent imminent or life-threatening deterioration of the following conditions: Alcohol withdrawal.   Critical care was time spent personally by me on the following activities:  Development of treatment plan with patient or surrogate, discussions with consultants, evaluation of patient's response to treatment, examination of patient, ordering and review of laboratory studies, ordering and review of radiographic studies, ordering and performing treatments and interventions, pulse oximetry, re-evaluation of patient's condition and review of old charts   Care discussed with: admitting provider       Medications Ordered in ED Medications  lactated ringers infusion ( Intravenous New Bag/Given 11/13/22 2019)  thiamine (VITAMIN B1) tablet 100 mg (100 mg Oral Given 11/13/22 1942)    Or  thiamine (VITAMIN B1) injection 100 mg ( Intravenous See Alternative 11/13/22 1942)  ondansetron (ZOFRAN) injection 4 mg (4 mg Intravenous Given 11/13/22 1831)  lactated ringers bolus 1,000 mL (0 mLs Intravenous Stopped 11/13/22 2034)    And  lactated ringers bolus 1,000 mL (0 mLs Intravenous  Stopped 11/13/22 2033)    And  lactated ringers bolus 500 mL (0 mLs Intravenous Stopped 11/13/22 2035)  ceFEPIme (MAXIPIME) 2 g in sodium chloride 0.9 % 100 mL IVPB (0 g Intravenous Stopped 11/13/22 2034)  diazepam (VALIUM) injection 5 mg (5 mg Intravenous Given 11/13/22 1829)  iohexol (OMNIPAQUE) 350 MG/ML injection 75 mL (75 mLs Intravenous Contrast Given 11/13/22 1932)  LORazepam (ATIVAN) tablet 1 mg (1 mg Oral Given 11/13/22 2059)    ED Course/ Medical Decision Making/ A&P Clinical Course as of 11/13/22 2111  Tue Nov 13, 2022  2049 CT Head Wo Contrast [LM]    Clinical Course User Index [LM] Ronny Bacon                             Medical Decision Making Amount and/or Complexity of Data Reviewed Labs: ordered. Radiology: ordered.  Risk OTC drugs. Prescription drug management.   This patient presents to the ED for concern of chest pain, shortness of breath, nausea, vomiting, this involves an extensive number of treatment options, and is a complaint that carries with it a high risk of complications and morbidity.  The differential diagnosis includes alcohol withdrawal, sepsis, pulmonary embolism, intra-abdominal etiology, and others   Co morbidities that complicate the patient evaluation  History of alcohol use gastritis, alcohol dependence, type II DM   Additional history obtained:  Additional history obtained from patient's wife at bedside External records from outside source obtained and reviewed including emergency department notes from November 24 with the patient was diagnosed with acute alcoholic gastritis   Lab Tests:  I Ordered, and personally interpreted labs.  The pertinent results include: Initial lactic 6.0, repeat 5.4, UA with moderate hemoglobin, rare bacteria; initial and repeat troponin 15, creatinine 1.58, mildly elevated AST, ALT, anion gap 29, grossly unremarkable CBC   Imaging Studies ordered:  I ordered imaging studies including CT angio PE  study, CT abdomen pelvis with contrast, CT head without contrast, chest x-ray I independently visualized and interpreted imaging which showed  CT of the chest: No evidence of pulmonary emboli.    Mild distal esophageal thickening which may be related to reflux  esophagitis.    CT of the abdomen and pelvis: Fatty infiltration of the liver.    Postsurgical changes in  the small bowel and stomach without acute  abnormality.  No acute intracranial abnormality No PE No acute findings on chest x-ray I agree with the radiologist interpretation   Cardiac Monitoring: / EKG:  The patient was maintained on a cardiac monitor.  I personally viewed and interpreted the cardiac monitored which showed an underlying rhythm of: Sinus tachycardia   Consultations Obtained:  I requested consultation with the family medicine resident,  and discussed lab and imaging findings as well as pertinent plan - they recommend: admission   Problem List / ED Course / Critical interventions / Medication management   I ordered medication including lactated Ringer's bolus, broad-spectrum antibiotics for possible sepsis, Valium and Ativan for withdrawal symptoms Reevaluation of the patient after these medicines showed that the patient improved I have reviewed the patients home medicines and have made adjustments as needed   Social Determinants of Health:  Patient endorses alcohol dependence   Test / Admission - Considered:  Patient's symptoms seem consistent with alcohol withdrawal at this time.  Most recent CIWA score was 5.  Patient will need admission for further monitoring and medication as needed.        Final Clinical Impression(s) / ED Diagnoses Final diagnoses:  Alcohol withdrawal syndrome with complication [facility])    Rx / DC Orders ED Discharge Orders     None         Ronny Bacon 11/13/22 2111    Sherwood Gambler, MD 11/14/22 [phone]

## 2022-11-13 NOTE — Sepsis Progress Note (Signed)
eLink is following this Code Sepsis. °

## 2022-11-13 NOTE — Assessment & Plan Note (Addendum)
Received Ativan '4mg'$ , CIWA trending dwon from 11>0>1. Patient is not interested in alcohol cessation and not interested in medication support at this time -CIWA with Ativan, stepdown protocol -LR at 162m/hr -Thiamine/Folate/MVI -TOC consult for substance use counseling

## 2022-11-13 NOTE — Assessment & Plan Note (Deleted)
Normalized

## 2022-11-13 NOTE — H&P (Cosign Needed Addendum)
Hospital Admission History and Physical Service Pager: [phone]  Patient name: Clayton Kennedy Medical record number: [id number] Date of Birth: 1961/01/15 Age: 62 y.o. Gender: male  Primary Care Provider: Ezequiel Essex, MD Consultants: none Code Status: FULL  Preferred Emergency Contact: Clayton Kennedy [phone]  Chief Complaint: chest pain, SOB, n/v  Assessment and Plan: Clayton Kennedy is a 62 y.o. male with PMHx of alcohol dependence, gastritis, diverticulitis, T2DM, HTN, HLD presenting with alcohol withdrawal. Differential for presentation of this includes alcohol withdrawal given history of heavy etOH use and recent discontinuation. DKA also initially suspected but less likely given minimal elevation of BHB. Infectious etiology initially suspected given his tachycardia, elevated LA but no evidence of infectious process and symptoms better explained by [name].   * Alcohol withdrawal ([place]) Quit drinking 2 days ago, drinking 6-24 beers daily prior to that. Presented with N/V and SOB, initially thought to be a sepsis picture with lactate elevated to 6.0. Much improved with administration of valium and ativan. No recent infectious symptoms to suggest an infectious etiology. Will treat for withdrawals. Would be interested in naltrexone upon discharge.  - Admit to progressive - CIWA with Ativan, stepdown protocol - LR at 115m/hr - Thiamine/Folate/MVI - Will start naltrexone at discharge - TOC consult for substance use counseling - AM BMP, CBC  Polycythemia Fairly significant to 19.4 on admission. Per chart review has run in the 17s in the past. Suspect there is an acute volume depletion component in the setting of his withdrawals but also chronic component that does not seem to have ever been worked up. High suspicion for OSA in this never smoker. - Trend CBC - Smear unremarkable - Recommend outpatient sleep study and, if negative, outpatient workup   Lactic acid increased Lactic  acid 6.0>5.4. Suspect 2/2 etOH withdrawals and significant dehydration on admission. S/p 2.5L LR resuscitation in the ED. Currently with LR running at 150 mL/hr.  - Trend lactate   Gastritis Benign abdominal exam today. - Continue home BID pepcid   Type 2 diabetes mellitus, controlled ([place] History of T2DM. Last A1c 6.5 in 07/2018. Has not followed up with clinic since 2019 and did not report that he has been taking any diabetes medications. BHB 0.72. -f/u A1c -moderate SSI -on statin   Essential hypertension Hypertensive here in the setting of acute withdrawals. On Losartan at home, though with bump in Cr (1.58, unclear baseline but was 1.32 during ED visit 3 mo ago). - Suspect Cr bump is prerenal in the setting of profound dehydration - Trend BMP and restart Losartan as renal function allows    FEN/GI: heart healthy VTE Prophylaxis: lovenox  Disposition: progressive  History of Present Illness:  Clayton Kennedy a 62y.o. male with PMHx of alcohol dependence, gastritis, diverticulitis, T2DM, HTN, HLD presenting with chest pain, SOB, n/v in the setting of alcohol withdrawal.   Patient reports drinking up to a case of beer. Reports that he stopped 2 days ago but does not specify why. He reports drinking alcohol to use up his time after retirement. Denies any current abdominal pain, HA, urinary sx, chest pain. Reports that he is feeling much better now compared to when he came in. Reports no history of alcohol withdrawals previously, seizures. No hallucinations currently. Denies any recent fever or chills.   In the ED, patient reported chest pain, SOB, n/v for 2 days. Recent dx with COVID and completed paxlovid. H/o 6pack beer daily and stopped 2 days ago. Appeared  septic, tremulous and tachycardic to 120s. LA 6.0 > 5.4. Code sepsis, given LR bolus, cefepime, flagyl, vancomycin. Bcx pending. Then more suspicious for alcohol withdrawal and given IV valium '5mg'$  x1, oral ativan '1mg'$  x1, thiamine.  Hypertensive and tachycardic. CIWA 14 > 5 > 11. Bcx pending. Alcohol level <10. CTA no evidence of PE.   Review Of Systems: Per HPI with the following additions: None  Pertinent Past Medical History: Alcohol dependence Gastritis Diverticulitis T2DM HTN HLD   Remainder reviewed in history tab.   Pertinent Past Surgical History: Partial colectomy for diverticulitis    Remainder reviewed in history tab.   Pertinent Social History: Tobacco use: None Alcohol use: Yes, up to a case per day Other Substance use: MJ 3x/day  Pertinent Family History: Father with alcohol use Mother and sister with esophageal cancer  Remainder reviewed in history tab.   Important Outpatient Medications: Lipitor 40 daily Pepcid 20 BID Gabapentin 600 QHS Losartan 100 daily Metformin 500 BID   Remainder reviewed in medication history.   Objective: BP (!) 169/88 (BP Location: Right Arm)   Pulse 100   Temp 98.3 F (36.8 C) (Oral)   Resp 17   Ht '5\' 11"'$  (1.803 m)   Wt 77.1 kg   SpO2 100%   BMI 23.71 kg/m  Exam: General: alert, in no acute distress. Mildy tremulous  HENT: MMM, poor dentition Cardiovascular: Tachycardic. Regular rhythm. No m/r/g.  Respiratory: CTAB. Normal WOB on RA.  Gastrointestinal: Soft, non-tender, non-distended.  MSK: slight tremors in BUE in bed Neuro: alert and oriented to situation, answering questions appropriately. Psych: normal mood and affect.   Labs:  CBC BMET  Recent Labs  Lab 11/13/22 1623 11/13/22 1814  WBC 8.2  --   HGB 17.9* 19.4*  HCT 52.8* 57.0*  PLT 111*  --    Recent Labs  Lab 11/13/22 1623 11/13/22 1814  NA 135 133*  K 4.1 3.8  CL 89*  --   CO2 17*  --   BUN 18  --   CREATININE 1.58*  --   GLUCOSE 263*  --   CALCIUM 10.2  --     Pertinent additional labs LA 5.4 > 6.0.  EKG: My own interpretation (not copied from electronic read)  -sinus tachycardia, Qtc 471   Imaging Studies Performed:  CT of the chest: No evidence of  pulmonary emboli. Mild distal esophageal thickening which may be related to reflux esophagitis. CT of the abdomen and pelvis: Fatty infiltration of the liver. Postsurgical changes in the small bowel and stomach without acute abnormality.  CT head: no acute intracranial abnormality CXR: No active cardiopulmonary disease    EKG sinus tachycardia without ST elevations or derpression, QTc 471   Eppie Gibson, MD 11/13/2022, 11:37 PM PGY-1, [place] Intern pager: [phone], text pages welcome Secure chat group [place]

## 2022-11-13 NOTE — Progress Notes (Signed)
Pharmacy Antibiotic Note  Clayton Kennedy is a 62 y.o. male admitted on 11/13/2022 with sepsis.  Pharmacy has been consulted for vancomycin and cefepime dosing.  Plan: Vancomycin 1500 mg IV q24h (eAUC 525) starting 3/6 Cefepime 2 g IV q12h Monitor signs/symptoms of infection Follow for de-escalation  Height: '5\' 11"'$  (180.3 cm) Weight: 77.1 kg (170 lb) IBW/kg (Calculated) : 75.3  Temp (24hrs), Avg:99.5 F (37.5 C), Min:99.1 F (37.3 C), Max:99.8 F (37.7 C)  Recent Labs  Lab 11/13/22 1621 11/13/22 1623  WBC  --  8.2  CREATININE  --  1.58*  LATICACIDVEN 6.0*  --     Estimated Creatinine Clearance: 53.6 mL/min (A) (by C-G formula based on SCr of 1.58 mg/dL (H)).    No Known Allergies  Antimicrobials this admission: Vancomycin 3/5 >>  Cefepime 3/5 >>   Dose adjustments this admission:   Microbiology results:   Thank you for allowing pharmacy to be a part of this patient's care.  Jeneen Rinks [id number] PM

## 2022-11-13 NOTE — ED Triage Notes (Signed)
Pt c/o centralized sharp chest pain, cough, and n/v for approx 2 days. Denies sick contacts.

## 2022-11-13 NOTE — ED Notes (Signed)
Patient transported to CT 

## 2022-11-13 NOTE — Hospital Course (Signed)
Demonie Drumwright is a 62 y.o.male with a history of alcohol dependence, gastritis, diverticulitis, T2DM, HTN, HLD  who was admitted to the [facility] Medicine Teaching Service at [facility] for alcohol withdrawal. His hospital course is detailed below:  Viral gastroenteritis Admitted with diarrhea and vomiting x 4 days. Initially hypotensive and received sepsis work up in the ED. Work up largely negative and attributed to alcohol withdrawal. GI symptoms were the cause of alcohol cessation per patient. Reports some weakness, initial difficult ambulating. After fluids, patient ambulating without assistance or difficulty. Supportive management with near resolution of symptoms by discharge.  Alcohol withdrawal Reports 6-24 beers daily but stopped drinking 2 days ago due to N/V. CIWA with Ativan and patient received Ativan '4mg'$ . Denies withdrawal symptoms at the time of discharge. Patient not interested in alcohol cessation or resources.   Other chronic conditions were medically managed with home medications and formulary alternatives as necessary (Gastritis)  PCP Follow-up Recommendations: 1) Recommend outpatient sleep study  2) Alcohol cessation resources

## 2022-11-13 NOTE — Assessment & Plan Note (Signed)
Resolved, likely in the setting of dehydration.

## 2022-11-13 NOTE — Assessment & Plan Note (Signed)
Mildly hypertensive. Renal function improved. -Consider restarting home Losartan

## 2022-11-13 NOTE — Assessment & Plan Note (Signed)
Benign abdominal exam today. - Continue home BID pepcid

## 2022-11-13 NOTE — Assessment & Plan Note (Addendum)
No recent A1c. BGL 120s, appears well controlled. -f/u A1c -mSSI -Lipid panel

## 2022-11-13 NOTE — ED Provider Triage Note (Signed)
Emergency Medicine Provider Triage Evaluation Note  Clayton Kennedy , a 62 y.o. male  was evaluated in triage.  Pt complains of chest pain.  Patient reports that he recently had COVID about 2 weeks ago and has still been having some lingering symptoms since then.  Increasing chest pain and shortness of breath.  Patient reports that he has been having cold sweats and is diaphoretic.  Patient's wife is concerned about extensive family history of cancer.  Not currently diagnosed with any cancer.  He is also now having some difficult to control nausea and vomiting over the last few days as well.  Reports he is unable to keep anything down.  Review of Systems  Positive: As above Negative: As above  Physical Exam  BP (!) 160/108 (BP Location: Right Arm)   Pulse (!) 120   Temp 99.1 F (37.3 C) (Oral)   Resp (!) 22   Ht '5\' 11"'$  (1.803 m)   Wt 77.1 kg   SpO2 99%   BMI 23.71 kg/m  Gen:   Awake, anxious and diaphoretic Resp:  Normal effort  MSK:   Moves extremities without difficulty  Other:    Medical Decision Making  Medically screening exam initiated at 4:09 PM.  Appropriate orders placed.  Davaris Wigton was informed that the remainder of the evaluation will be completed by another provider, this initial triage assessment does not replace that evaluation, and the importance of remaining in the ED until their evaluation is complete.     Luvenia Heller, PA-C 11/13/22 [phone]

## 2022-11-14 DIAGNOSIS — F10930 Alcohol use, unspecified with withdrawal, uncomplicated: Secondary | ICD-10-CM | POA: Diagnosis not present

## 2022-11-14 DIAGNOSIS — A084 Viral intestinal infection, unspecified: Secondary | ICD-10-CM | POA: Insufficient documentation

## 2022-11-14 LAB — BASIC METABOLIC PANEL
Anion gap: 14 (ref 5–15)
BUN: 14 mg/dL (ref 6–20)
CO2: 25 mmol/L (ref 22–32)
Calcium: 9.3 mg/dL (ref 8.9–10.3)
Chloride: 94 mmol/L — ABNORMAL LOW (ref 98–111)
Creatinine, Ser: 0.96 mg/dL (ref 0.61–1.24)
GFR, Estimated: >60 mL/min (ref >60)
Glucose, Bld: 114 mg/dL — ABNORMAL HIGH (ref 70–99)
Potassium: 3.4 mmol/L — ABNORMAL LOW (ref 3.5–5.1)
Sodium: 133 mmol/L — ABNORMAL LOW (ref 135–145)

## 2022-11-14 LAB — GLUCOSE, CAPILLARY
Glucose-Capillary: 210 mg/dL — ABNORMAL HIGH (ref 70–99)
Glucose-Capillary: 120 mg/dL — ABNORMAL HIGH (ref 70–99)
Glucose-Capillary: 127 mg/dL — ABNORMAL HIGH (ref 70–99)
Glucose-Capillary: 147 mg/dL — ABNORMAL HIGH (ref 70–99)
Glucose-Capillary: 186 mg/dL — ABNORMAL HIGH (ref 70–99)
Glucose-Capillary: 170 mg/dL — ABNORMAL HIGH (ref 70–99)
Glucose-Capillary: 126 mg/dL — ABNORMAL HIGH (ref 70–99)

## 2022-11-14 LAB — CBC
HCT: 44.2 % (ref 39.0–52.0)
Hemoglobin: 15.9 g/dL (ref 13.0–17.0)
MCH: 31.5 pg (ref 26.0–34.0)
MCHC: 36 g/dL (ref 30.0–36.0)
MCV: 87.5 fL (ref 80.0–100.0)
Platelets: 91 10*3/uL — ABNORMAL LOW (ref 150–400)
RBC: 5.05 MIL/uL (ref 4.22–5.81)
RDW: 12.7 % (ref 11.5–15.5)
WBC: 10.1 10*3/uL (ref 4.0–10.5)
nRBC: 0 % (ref 0.0–0.2)

## 2022-11-14 LAB — LACTIC ACID, PLASMA
Lactic Acid, Venous: 4.1 mmol/L (ref 0.5–1.9)
Lactic Acid, Venous: 1.4 mmol/L (ref 0.5–1.9)

## 2022-11-14 LAB — MAGNESIUM: Magnesium: 1.9 mg/dL (ref 1.7–2.4)

## 2022-11-14 LAB — HIV ANTIBODY (ROUTINE TESTING W REFLEX): HIV Screen 4th Generation wRfx: NONREACTIVE

## 2022-11-14 MED ORDER — POTASSIUM CHLORIDE 20 MEQ PO PACK
40.0000 meq | PACK | Freq: Once | ORAL | Status: AC
  Administered 2022-11-14: 40 meq via ORAL
  Filled 2022-11-14: qty 2

## 2022-11-14 MED ORDER — LOPERAMIDE HCL 2 MG PO CAPS: 2.0000 mg | ORAL_CAPSULE | ORAL | Status: DC | PRN

## 2022-11-14 NOTE — Progress Notes (Signed)
Patients wife Clayton Kennedy [phone] is concerned that because a family member that was tested positive for Covid was also diagnosed with a stroke, wife believes patient needs to be worked up for a stroke since he is Covid+. Patient is A&Ox4 with no deficits.

## 2022-11-14 NOTE — Progress Notes (Signed)
Daily Progress Note Intern Pager: [phone]  Patient name: Clayton Kennedy Medical record number: [id number] Date of birth: 08/25/1961 Age: 62 y.o. Gender: male  Primary Care Provider: Ezequiel Essex, MD Consultants: None Code Status: Full code  Pt Overview and Major Events to Date:  3/5 Admitted to FMTS  Assessment and Plan: Clayton Kennedy is a 62 y.o. male with PMHx of alcohol dependence, gastritis, diverticulitis, T2DM, HTN, HLD presenting with alcohol withdrawal.    * Alcohol withdrawal ([place]) Received Ativan '4mg'$ , CIWA trending dwon from 11>0>1. Patient is not interested in alcohol cessation and not interested in medication support at this time -CIWA with Ativan, stepdown protocol -LR at 157m/hr -Thiamine/Folate/MVI -TOC consult for substance use counseling  Viral gastroenteritis Diarrhea, abdominal pain and N/V x 4 days ago. Started prior to alcohol cessation. Denies fever. Likely secondary viral gastroenteritis. Encouraged PO rehydration in addition to MIVF -Imodium prn  Type 2 diabetes mellitus, controlled ([place] No recent A1c. BGL 120s, appears well controlled. -f/u A1c -mSSI -Lipid panel  Essential hypertension Mildly hypertensive. Renal function improved. -Consider restarting home Losartan  Lactic acid increased-resolved as of 11/14/2022 Normalized  Polycythemia-resolved as of 11/14/2022 Resolved, likely in the setting of dehydration.   Chronic conditions: Gastritis: continue Pepcid BID  FEN/GI: Heart healthy PPx: Lovenox Dispo: home pending clinical stability  Subjective:  Patient assessed at bedside, states he has been having diarrhea overnight. States he was unable to get up from bed in time and stooled over himself. States he vomited twice overnight as well. States his N/V and diarrhea started 4 days and he stopped drinking due to his vomiting and abdominal pain. States he is felling better today but has some dizziness upon standing. Patient expressed no  interested in alcohol cessation.  Objective: Temp:  [98.1 F (36.7 C)-99.8 F (37.7 C)] 98.5 F (36.9 C) (03/06 1116) Pulse Rate:  [83-120] 99 (03/06 1116) Resp:  [17-28] 18 (03/06 1116) BP: (137-177)/(85-128) 143/87 (03/06 1116) SpO2:  [95 %-100 %] 100 % (03/06 1116) Weight:  [77.1 kg] 77.1 kg (03/05 1554) Physical Exam: General: Alert, poor dentition, NAD Cardiovascular: RRR, no murmur Respiratory: CTAB, Normal WOB on RA Abdomen: Soft, non-tender, non-distended. +BS Extremities: No peripheral edema  Laboratory: Most recent CBC Lab Results  Component Value Date   WBC 10.1 11/14/2022   HGB 15.9 11/14/2022   HCT 44.2 11/14/2022   MCV 87.5 11/14/2022   PLT 91 (L) 11/14/2022   Most recent BMP    Latest Ref Rng & Units 11/14/2022    4:51 AM  BMP  Glucose 70 - 99 mg/dL 114   BUN 6 - 20 mg/dL 14   Creatinine 0.61 - 1.24 mg/dL 0.96   Sodium 135 - 145 mmol/L 133   Potassium 3.5 - 5.1 mmol/L 3.4   Chloride 98 - 111 mmol/L 94   CO2 22 - 32 mmol/L 25   Calcium 8.9 - 10.3 mg/dL 9.3     Other pertinent labs: Glu: 114 Lactate: 1.4 Mag: 1.9  Imaging/Diagnostic Tests: CT Angio Chest PE W/Cm &/Or Wo Cm Result Date: 11/13/2022 IMPRESSION: CT of the chest: No evidence of pulmonary emboli. Mild distal esophageal thickening which may be related to reflux esophagitis. CT of the abdomen and pelvis: Fatty infiltration of the liver. Postsurgical changes in the small bowel and stomach without acute abnormality.   CT ABDOMEN PELVIS W CONTRAST Result Date: 11/13/2022 IMPRESSION: CT of the chest: No evidence of pulmonary emboli. Mild distal esophageal thickening which may be related to  reflux esophagitis. CT of the abdomen and pelvis: Fatty infiltration of the liver. Postsurgical changes in the small bowel and stomach without acute abnormality.   CT Head Wo Contrast Result Date: 11/13/2022 IMPRESSION: 1. No acute intracranial abnormality.   DG Chest 2 View Result Date: 11/13/2022 IMPRESSION:  No active cardiopulmonary disease.   Colletta Maryland, MD 11/14/2022, 12:46 PM  PGY-1, [place] Intern pager: [phone], text pages welcome Secure chat group [place]

## 2022-11-14 NOTE — Evaluation (Signed)
Physical Therapy Evaluation Patient Details Name: Clayton Kennedy MRN: [id number] DOB: January 03, 1961 Today's Date: 11/14/2022  History of Present Illness  62 y.o. male presents to [facility] hospital on 11/13/2022 with alcohol withdrawal. PMH includes gastritis, diverticulitis, DMII, HTN, HLD.  Clinical Impression  Pt presents to PT with deficits in functional mobility, gait, balance, endurance, cognition. Pt is very unsteady at this time, with great difficulty maintaining balance when ambulating, statically standing, and when performing functional ADLs. Pt is very impulsive during session, setting bed alarm off at least 4 times. Pt intermittently expresses awareness of deficits, but then often attempts to mobilize unassisted. Pt is at a high risk for falls currently, PT recommends SNF placement at this time.       Recommendations for follow up therapy are one component of a multi-disciplinary discharge planning process, led by the attending physician.  Recommendations may be updated based on patient status, additional functional criteria and insurance authorization.  Follow Up Recommendations Skilled nursing-short term rehab (<3 hours/day) (may progress quickly, but unsafe to go home currently) Can patient physically be transported by private vehicle: Yes    Assistance Recommended at Discharge Frequent or constant Supervision/Assistance  Patient can return home with the following  A lot of help with walking and/or transfers;A lot of help with bathing/dressing/bathroom;Assistance with cooking/housework;Direct supervision/assist for medications management;Direct supervision/assist for financial management;Assist for transportation;Help with stairs or ramp for entrance    Equipment Recommendations BSC/3in1 (TBD pending further progress)  Recommendations for Other Services       Functional Status Assessment Patient has had a recent decline in their functional status and demonstrates the ability to make significant  improvements in function in a reasonable and predictable amount of time.     Precautions / Restrictions Precautions Precautions: Fall Precaution Comments: constant diarrhea per RN Restrictions Weight Bearing Restrictions: No      Mobility  Bed Mobility Overal bed mobility: Needs Assistance Bed Mobility: Supine to Sit, Sit to Supine     Supine to sit: Supervision Sit to supine: Supervision        Transfers Overall transfer level: Needs assistance Equipment used: None Transfers: Sit to/from Stand Sit to Stand: Min assist           General transfer comment: often with posterior lean, 2-3 times descending back onto bed    Ambulation/Gait Ambulation/Gait assistance: Min assist, Mod assist Gait Distance (Feet): 100 Feet Assistive device: None Gait Pattern/deviations: Step-through pattern, Staggering right, Staggering left Gait velocity: reduced Gait velocity interpretation: <1.8 ft/sec, indicate of risk for recurrent falls   General Gait Details: pt with slowed step-through gait, intermittent staggering laterally requiring PT assistance to correct losses of balance  Stairs            Wheelchair Mobility    Modified Rankin (Stroke Patients Only)       Balance Overall balance assessment: Needs assistance Sitting-balance support: No upper extremity supported, Feet supported Sitting balance-Leahy Scale: Fair     Standing balance support: No upper extremity supported, During functional activity Standing balance-Leahy Scale: Poor Standing balance comment: minA-modA Single Leg Stance - Right Leg: 0 Single Leg Stance - Left Leg: 0 Tandem Stance - Right Leg: 0 Tandem Stance - Left Leg: 0 [place] - Eyes Opened: 0 [place] - Eyes Closed: 0   High Level Balance Comments: large posterior losses of balance with 2 attempts to maintain static standing with eyes closed, unable to obtain starting position for other balance tests with eyes open without UE support  Pertinent Vitals/Pain Pain Assessment Pain Assessment: No/denies pain    Home Living Family/patient expects to be discharged to:: Private residence Living Arrangements: Spouse/significant other Available Help at Discharge: Family;Available PRN/intermittently Type of Home: House       Alternate Level Stairs-Number of Steps: flight Home Layout: Two level Home Equipment: None      Prior Function Prior Level of Function : Independent/Modified Independent                     Hand Dominance        Extremity/Trunk Assessment   Upper Extremity Assessment Upper Extremity Assessment: RUE deficits/detail;LUE deficits/detail RUE Coordination: decreased fine motor LUE Coordination: decreased fine motor    Lower Extremity Assessment Lower Extremity Assessment: RLE deficits/detail;LLE deficits/detail RLE Coordination: decreased gross motor LLE Coordination: decreased gross motor    Cervical / Trunk Assessment Cervical / Trunk Assessment: Normal  Communication   Communication: No difficulties  Cognition Arousal/Alertness: Awake/alert Behavior During Therapy: Impulsive Overall Cognitive Status: Impaired/Different from baseline Area of Impairment: Awareness                           Awareness: Emergent            General Comments General comments (skin integrity, edema, etc.): tachy into 120s during session, diaphoretic. Stool stains noted on pad and bed, pt with incontinence throughout the day per RN report    Exercises     Assessment/Plan    PT Assessment Patient needs continued PT services  PT Problem List Decreased activity tolerance;Decreased balance;Decreased mobility;Decreased cognition;Decreased knowledge of use of DME;Decreased safety awareness;Decreased knowledge of precautions       PT Treatment Interventions DME instruction;Gait training;Functional mobility training;Therapeutic activities;Stair training;Balance  training;Neuromuscular re-education;Cognitive remediation;Patient/family education;Therapeutic exercise    PT Goals (Current goals can be found in the Care Plan section)  Acute Rehab PT Goals Patient Stated Goal: to return home to be with his dog PT Goal Formulation: With patient Time For Goal Achievement: 11/28/22 Potential to Achieve Goals: Fair Additional Goals Additional Goal #1: Pt will score >19/24 on the DGI to indicate a reduced risk for falls    Frequency Min 3X/week     Co-evaluation               AM-PAC PT "6 Clicks" Mobility  Outcome Measure Help needed turning from your back to your side while in a flat bed without using bedrails?: A Little Help needed moving from lying on your back to sitting on the side of a flat bed without using bedrails?: A Little Help needed moving to and from a bed to a chair (including a wheelchair)?: A Lot Help needed standing up from a chair using your arms (e.g., wheelchair or bedside chair)?: A Little Help needed to walk in hospital room?: A Lot Help needed climbing 3-5 steps with a railing? : Total 6 Click Score: 14    End of Session   Activity Tolerance: Patient tolerated treatment well Patient left: in bed;with call bell/phone within reach;with bed alarm set Nurse Communication: Mobility status PT Visit Diagnosis: Other abnormalities of gait and mobility (R26.89);Difficulty in walking, not elsewhere classified (R26.2)    Time: 1705-1730 PT Time Calculation (min) (ACUTE ONLY): 25 min   Charges:   PT Evaluation $PT Eval Low Complexity: [place], PT, DPT Acute Rehabilitation Office [phone]   Zenaida Niece 11/14/2022, 5:39  PM

## 2022-11-14 NOTE — Progress Notes (Signed)
Arrived to unit via stretcher. Assisted to bed by staff. Verbalizes nausea and hiccups. Diaphoretic, tremulous, anxious, mildly agitated, repeating that he is hot and cold. Oriented x 4 and verbally appropriate. Able to express purpose of admission. Reports drinking 2-3 6 packs of beer daily and that last drink was 2.5 days ago because he was having abdominal pain, nausea and dizziness.   Oriented to room, surroundings and plan of care. MD contacted for antacid. Medicated per order, prn ativan administered for withdrawal symptoms.    At approximately 0130 pt noted sitting up in chair putting on personal clothing. IV pulled out, tele monitor off. States that he felt he was going to have a BM and needed to get up quickly. BM noted on bed, chairs and floor around bed. Pt very agitated and anxious. Assisted with shower and incontinent care then back to bed. Reoriented to role of nurse and care team and advised to use call bell when wanting to get out of bed and move around room because of tremors and unsteady gait. Medicated with ativan 2 mg. Call bell placed in reach, bed alarm engaged.

## 2022-11-14 NOTE — Assessment & Plan Note (Addendum)
Diarrhea resolved and vomiting triggered by potassium supplementation. PT recommending SNF but patient ambulated from bed to door and back without balance issue or assistance. Plan for [facility] PT and imminent discharge. -Imodium prn
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
## 2022-11-14 NOTE — Progress Notes (Signed)
Infection Prevention  Received call from: Charge Nurse Unit:[id number] Regarding: COVID + Isolation IP Recommendation: Upon further review of noted previous positive "two weeks ago" and most recent 11/13/22 COVID positive PCR CT value represents a history of COVID-19 and is in the non-infectious range. Airborne/contact Isolation in not required.

## 2022-11-15 DIAGNOSIS — K2900 Acute gastritis without bleeding: Secondary | ICD-10-CM | POA: Diagnosis not present

## 2022-11-15 DIAGNOSIS — F10930 Alcohol use, unspecified with withdrawal, uncomplicated: Secondary | ICD-10-CM | POA: Diagnosis not present

## 2022-11-15 LAB — BASIC METABOLIC PANEL
Anion gap: 16 — ABNORMAL HIGH (ref 5–15)
BUN: <5 mg/dL — ABNORMAL LOW (ref 6–20)
CO2: 26 mmol/L (ref 22–32)
Calcium: 9.6 mg/dL (ref 8.9–10.3)
Chloride: 92 mmol/L — ABNORMAL LOW (ref 98–111)
Creatinine, Ser: 0.9 mg/dL (ref 0.61–1.24)
GFR, Estimated: >60 mL/min (ref >60)
Glucose, Bld: 122 mg/dL — ABNORMAL HIGH (ref 70–99)
Potassium: 3.2 mmol/L — ABNORMAL LOW (ref 3.5–5.1)
Sodium: 134 mmol/L — ABNORMAL LOW (ref 135–145)

## 2022-11-15 LAB — LIPID PANEL
Cholesterol: 194 mg/dL (ref 0–200)
HDL: 56 mg/dL (ref >40)
LDL Cholesterol: 107 mg/dL — ABNORMAL HIGH (ref 0–99)
Total CHOL/HDL Ratio: 3.5 RATIO
Triglycerides: 154 mg/dL — ABNORMAL HIGH (ref <150)
VLDL: 31 mg/dL (ref 0–40)

## 2022-11-15 LAB — HEMOGLOBIN A1C
Hgb A1c MFr Bld: 7.6 % — ABNORMAL HIGH (ref 4.8–5.6)
Mean Plasma Glucose: 171 mg/dL

## 2022-11-15 LAB — GLUCOSE, CAPILLARY
Glucose-Capillary: 134 mg/dL — ABNORMAL HIGH (ref 70–99)
Glucose-Capillary: 148 mg/dL — ABNORMAL HIGH (ref 70–99)

## 2022-11-15 MED ORDER — POTASSIUM CHLORIDE 20 MEQ PO PACK: 40.0000 meq | PACK | Freq: Once | ORAL | Status: DC

## 2022-11-15 MED ORDER — POTASSIUM CHLORIDE 20 MEQ PO PACK
40.0000 meq | PACK | ORAL | Status: DC
  Administered 2022-11-15: 40 meq via ORAL
  Filled 2022-11-15: qty 2

## 2022-11-15 NOTE — Progress Notes (Signed)
     Daily Progress Note Intern Pager: [phone]  Patient name: Clayton Kennedy Medical record number: [id number] Date of birth: 06-Nov-1960 Age: 62 y.o. Gender: male  Primary Care Provider: Eppie Gibson, MD Consultants: None Code Status: Full code   Pt Overview and Major Events to Date:  3/5 Admitted to FMTS   Assessment and Plan: Clayton Kennedy is a 62 y.o. male with PMHx of alcohol dependence, gastritis, diverticulitis, T2DM, HTN, HLD presenting with alcohol withdrawal.    * Alcohol withdrawal ([place]) CIWAs 1, no prn Ativan needed in the past 24 hours.  -CIWA with Ativan, stepdown protocol -Thiamine/Folate/MVI  Viral gastroenteritis Diarrhea resolved and vomiting triggered by potassium supplementation. PT recommending SNF but patient ambulated from bed to door and back without balance issue or assistance. Plan for [facility] PT and imminent discharge. -Imodium prn  Type 2 diabetes mellitus, controlled (HCC) A1c 7.6. BGL well controlled, has not required sliding scale. Mildly elevated LDL -mSSI -Continue home Atorvastatin '40mg'$   Essential hypertension Hypertensive to 160s/90s. -Consider restarting home Losartan   Chronic conditions: Gastritis: continue Pepcid BID   FEN/GI: Heart healthy PPx: Lovenox Dispo: home pending clinical stability  Subjective:  Patient assessed at bedside, does not report any further episodes of diarrhea. States he vomited after drinking the potassium because it burned his throat but otherwise feels it has improved. States he does still feel weak some times but has been drinking water. Some nausea and lower appetite than usual.  Objective: Temp:  [97.7 F (36.5 C)-98.5 F (36.9 C)] 97.7 F (36.5 C) (03/07 0800) Pulse Rate:  [88-104] 91 (03/07 0800) Resp:  [17-24] 20 (03/07 0800) BP: (128-162)/(82-96) 142/88 (03/07 0800) SpO2:  [93 %-100 %] 97 % (03/07 0800) Physical Exam: General: Alert, poor dentition, NAD Cardiovascular: RRR, no murmur Respiratory:  CTAB, Normal WOB on RA Abdomen: Soft, non-tender, non-distended. +BS Extremities: No peripheral edema. Ambulated to door and back to bed without assistance or balance concerns  Laboratory: Most recent CBC Lab Results  Component Value Date   WBC 10.1 11/14/2022   HGB 15.9 11/14/2022   HCT 44.2 11/14/2022   MCV 87.5 11/14/2022   PLT 91 (L) 11/14/2022   Most recent BMP    Latest Ref Rng & Units 11/15/2022    1:44 AM  BMP  Glucose 70 - 99 mg/dL 122   BUN 6 - 20 mg/dL <5   Creatinine 0.61 - 1.24 mg/dL 0.90   Sodium 135 - 145 mmol/L 134   Potassium 3.5 - 5.1 mmol/L 3.2   Chloride 98 - 111 mmol/L 92   CO2 22 - 32 mmol/L 26   Calcium 8.9 - 10.3 mg/dL 9.6     Other pertinent labs: Total cholesterol: 194 LDL: 107 Triglyceride: Palmer, MD 11/15/2022, 9:15 AM  PGY-1, [place] Intern pager: [phone], text pages welcome Secure chat group [place]

## 2022-11-15 NOTE — Discharge Summary (Signed)
[facility] Discharge Summary  Patient name: Clayton Kennedy Medical record number: [id number] Date of birth: 01/29/1961 Age: 62 y.o. Gender: male Date of Admission: 11/13/2022  Date of Discharge: 11/15/2022 Admitting Physician: Eppie Gibson, MD  Primary Care Provider: Jilda Panda, MD Consultants: None  Indication for Hospitalization: Alcohol withdrawal  Discharge Diagnoses/Problem List:  Principal Problem for Admission: Alcohol withdrawal Other Problems addressed during stay:  Principal Problem:   Alcohol withdrawal ([place]) Active Problems:   Essential hypertension   Type 2 diabetes mellitus, controlled ([place])   Viral gastroenteritis  Brief Hospital Course:  Clayton Kennedy is a 62 y.o.male with a history of alcohol dependence, gastritis, diverticulitis, T2DM, HTN, HLD  who was admitted to the [facility] Medicine Teaching Service at [facility] for alcohol withdrawal. His hospital course is detailed below:  Viral gastroenteritis Admitted with diarrhea and vomiting x 4 days. Initially hypotensive and received sepsis work up in the ED. Work up largely negative and attributed to alcohol withdrawal. GI symptoms were the cause of alcohol cessation per patient. Reports some weakness, initial difficult ambulating. After fluids, patient ambulating without assistance or difficulty. Supportive management with near resolution of symptoms by discharge.  Alcohol withdrawal Reports 6-24 beers daily but stopped drinking 2 days ago due to N/V. CIWA with Ativan and patient received Ativan '4mg'$ . Denies withdrawal symptoms at the time of discharge. Patient not interested in alcohol cessation or resources.   Other chronic conditions were medically managed with home medications and formulary alternatives as necessary (Gastritis)  PCP Follow-up Recommendations: 1) Recommend outpatient sleep study  2) Alcohol cessation resources  Disposition: [facility]  Discharge Condition:  Stable   Discharge Exam:  Vitals:   11/15/22 0338 11/15/22 0800  BP: (!) 162/96 (!) 142/88  Pulse: 88 91  Resp: (!) 24 20  Temp: 98.1 F (36.7 C) 97.7 F (36.5 C)  SpO2: 94% 97%   General: Alert, poor dentition, NAD Cardiovascular: RRR, no murmur Respiratory: CTAB, Normal WOB on RA Abdomen: Soft, non-tender, non-distended. +BS Extremities: No peripheral edema. Ambulated to door and back to bed without assistance or balance concerns  Significant Procedures: None  Significant Labs and Imaging:  Recent Labs  Lab 11/13/22 1623 11/13/22 1814 11/14/22 0451  WBC 8.2  --  10.1  HGB 17.9* 19.4* 15.9  HCT 52.8* 57.0* 44.2  PLT 111*  --  91*   Recent Labs  Lab 11/13/22 1623 11/13/22 1814 11/14/22 0451 11/15/22 0144  NA 135 133* 133* 134*  K 4.1 3.8 3.4* 3.2*  CL 89*  --  94* 92*  CO2 17*  --  25 26  GLUCOSE 263*  --  114* 122*  BUN 18  --  14 <5*  CREATININE 1.58*  --  0.96 0.90  CALCIUM 10.2  --  9.3 9.6  MG  --   --  1.9  --   ALKPHOS 64  --   --   --   AST 65*  --   --   --   ALT 56*  --   --   --   ALBUMIN 5.2*  --   --   --     Pertinent Imaging: CT Angio Chest PE W/Cm &/Or Wo Cm Result Date: 11/13/2022 IMPRESSION: CT of the chest: No evidence of pulmonary emboli. Mild distal esophageal thickening which may be related to reflux esophagitis. CT of the abdomen and pelvis: Fatty infiltration of the liver. Postsurgical changes in the small bowel and stomach without acute  abnormality.   CT ABDOMEN PELVIS W CONTRAST Result Date: 11/13/2022 IMPRESSION: CT of the chest: No evidence of pulmonary emboli. Mild distal esophageal thickening which may be related to reflux esophagitis. CT of the abdomen and pelvis: Fatty infiltration of the liver. Postsurgical changes in the small bowel and stomach without acute abnormality.  CT Head Wo Contrast Result Date: 11/13/2022 IMPRESSION: 1. No acute intracranial abnormality.  DG Chest 2 View Result Date: 11/13/2022 IMPRESSION: No  active cardiopulmonary disease.   Results/Tests Pending at Time of Discharge: None  Discharge Medications:  Allergies as of 11/15/2022   No Known Allergies      Medication List     STOP taking these medications    benzonatate 200 MG capsule Commonly known as: TESSALON   ketoconazole 2 % shampoo Commonly known as: NIZORAL       TAKE these medications    atorvastatin 40 MG tablet Commonly known as: LIPITOR Take 1 tablet (40 mg total) by mouth daily.   famotidine 20 MG tablet Commonly known as: PEPCID Take 1 tablet (20 mg total) by mouth 2 (two) times daily.   gabapentin 300 MG capsule Commonly known as: NEURONTIN Take 2 capsules (600 mg total) by mouth at bedtime. What changed: when to take this   ibuprofen 200 MG tablet Commonly known as: ADVIL Take 400 mg by mouth as needed for moderate pain.   losartan 100 MG tablet Commonly known as: COZAAR Take 1 tablet (100 mg total) by mouth daily.   metFORMIN 500 MG tablet Commonly known as: GLUCOPHAGE Take 1 tablet (500 mg total) 2 (two) times daily with a meal by mouth.   sildenafil 100 MG tablet Commonly known as: VIAGRA Take 100 mg by mouth daily as needed for erectile dysfunction.   triamcinolone ointment 0.1 % Commonly known as: KENALOG Apply 1 Application topically 2 (two) times daily as needed (rash).   TUMS PO Take 2 tablets by mouth as needed (heartburn).        Discharge Instructions: Please refer to Patient Instructions section of EMR for full details.  Patient was counseled important signs and symptoms that should prompt return to medical care, changes in medications, dietary instructions, activity restrictions, and follow up appointments.   Follow-Up Appointments:  Follow-up Information     Jilda Panda, MD. Schedule an appointment as soon as possible for a visit.   Specialty: Internal Medicine Why: Please schedule an appointment for a hospital follow up at your earliest convenience. Contact  information: [address] [place] [address] [phone]                 Colletta Maryland, MD 11/15/2022, 11:37 AM PGY-1, [place]

## 2022-11-15 NOTE — Discharge Instructions (Signed)
Dear Clayton Kennedy,   Thank you for letting us participate in your care! In this section, you will find a brief hospital admission summary of why you were admitted to the hospital, what happened during your admission, your diagnosis/diagnoses, and recommended follow up.  Primary diagnosis: Alcohol withdrawal Treatment plan: You were monitored for signs of withdrawal and given medication to help. Secondary diagnosis: Viral gastroenteritis Treatment plan: You were treated with supportive medications and fluids.   POST-HOSPITAL & CARE INSTRUCTIONS We recommend following up with your PCP within 1 week from being discharged from the hospital. Please let PCP/Specialists know of any changes in medications that were made which you will be able to see in the medications section of this packet.  DOCTOR'S APPOINTMENTS & FOLLOW UP No future appointments.   Thank you for choosing [facility]! Take care and be well!  [facility]  [place], [place] [address] [phone]

## 2022-11-15 NOTE — Progress Notes (Signed)
Physical Therapy Treatment Patient Details Name: Clayton Kennedy MRN: [id number] DOB: [birth year] Today's Date: 11/15/2022   History of Present Illness 62 y.o. male presents to [facility] hospital on 11/13/2022 with alcohol withdrawal. PMH includes gastritis, diverticulitis, DMII, HTN, HLD.    PT Comments    Pt received getting OOB unassisted, bed alarm sounding, pt ambulating into bathroom. Pt agreeable to therapy session with emphasis on transfer safety and standing balance assessment as well as stair negotiation. Pt needing up to modA for gait safety due to multiple losses of balance with head turns, stepping over obstacles (glove box), and turning. Pt scored 8/24 on Dynamic Gait Index, indicating a high fall risk. Scores of 19 or fewer indicate increased risk of falls in older community dwelling patients. Pt back in supine with bed alarm activated at end of session, RN notified of pt impulsivity and high fall risk as well as elevated BP intermittently during session. Pt A&O x2 during session and had difficulty wayfinding in hallway and with decreased memory of previous PT session and events during the day. Pt continues to benefit from PT services to progress toward functional mobility goals.   Recommendations for follow up therapy are one component of a multi-disciplinary discharge planning process, led by the attending physician.  Recommendations may be updated based on patient status, additional functional criteria and insurance authorization.  Follow Up Recommendations  Skilled nursing-short term rehab (<3 hours/day) (may progress quickly, but unsafe to go home currently) Can patient physically be transported by private vehicle: Yes   Assistance Recommended at Discharge Frequent or constant Supervision/Assistance  Patient can return home with the following A lot of help with walking and/or transfers;A lot of help with bathing/dressing/bathroom;Assistance with cooking/housework;Direct supervision/assist for  medications management;Direct supervision/assist for financial management;Assist for transportation;Help with stairs or ramp for entrance   Equipment Recommendations  BSC/3in1 (TBD pending further progress)    Recommendations for Other Services       Precautions / Restrictions Precautions Precautions: Fall Precaution Comments: constant diarrhea per RN Restrictions Weight Bearing Restrictions: No     Mobility  Bed Mobility Overal bed mobility: Needs Assistance Bed Mobility: Supine to Sit, Sit to Supine     Supine to sit: Modified independent (Device/Increase time) Sit to supine: Modified independent (Device/Increase time)        Transfers Overall transfer level: Needs assistance Equipment used: None Transfers: Sit to/from Stand Sit to Stand: Min assist           General transfer comment: Often with posterior lean vs lateral loss of balance    Ambulation/Gait Ambulation/Gait assistance: Min assist, Mod assist Gait Distance (Feet): 300 Feet Assistive device: None Gait Pattern/deviations: Step-through pattern, Staggering right, Staggering left, Decreased stride length, Narrow base of support Gait velocity: reduced     General Gait Details: Pt with slowed step-through gait, frequent staggering laterally requiring PTA assistance to correct losses of balance, see DGI for higher level balance activities   Stairs Stairs: Yes Stairs assistance: Min assist Stair Management: Two rails, Step to pattern, Forwards Number of Stairs: 3 General stair comments: heavy reliance on both rails, very unsteady, needing up to minA external assist in addition to bilateral railings.   Wheelchair Mobility    Modified Rankin (Stroke Patients Only)       Balance Overall balance assessment: Needs assistance Sitting-balance support: No upper extremity supported, Feet supported Sitting balance-Leahy Scale: Fair     Standing balance support: No upper extremity supported, During  functional activity Standing balance-Leahy Scale: Poor  Standing balance comment: minA-modA                 Standardized Balance Assessment Standardized Balance Assessment : Dynamic Gait Index   Dynamic Gait Index Level Surface: Moderate Impairment Change in Gait Speed: Moderate Impairment Gait with Horizontal Head Turns: Moderate Impairment Gait with Vertical Head Turns: Moderate Impairment Gait and Pivot Turn: Mild Impairment Step Over Obstacle: Severe Impairment Step Around Obstacles: Moderate Impairment Steps: Moderate Impairment Total Score: 8      Cognition Arousal/Alertness: Awake/alert Behavior During Therapy: Impulsive, Restless Overall Cognitive Status: Impaired/Different from baseline Area of Impairment: Awareness, Safety/judgement, Problem solving                         Safety/Judgement: Decreased awareness of safety, Decreased awareness of deficits Awareness: Intellectual Problem Solving: Slow processing, Difficulty sequencing, Requires verbal cues General Comments: Pt inconsistently oriented, not oriented to location/time consistently.        Exercises Other Exercises Other Exercises: supine BLE AROM: ankle pumps x10 reps ea    General Comments General comments (skin integrity, edema, etc.): Tachy to 120's bpm with exertional tasks, 90's bpm resting; SpO2 93-99% on RA with exertion. BP 148/100 seated EOB initially, BP 141/101 (113) standing prior to gait trial. BP elevated 151/93 post-exertion, RN notified of pt's elevated BP.      Pertinent Vitals/Pain Pain Assessment Pain Assessment: No/denies pain    Home Living                          Prior Function            PT Goals (current goals can now be found in the care plan section) Acute Rehab PT Goals Patient Stated Goal: To return home to be with his dog PT Goal Formulation: With patient Time For Goal Achievement: 11/28/22 Progress towards PT goals: Progressing toward  goals    Frequency    Min 3X/week      PT Plan Current plan remains appropriate    Co-evaluation              AM-PAC PT "6 Clicks" Mobility   Outcome Measure  Help needed turning from your back to your side while in a flat bed without using bedrails?: None Help needed moving from lying on your back to sitting on the side of a flat bed without using bedrails?: A Little Help needed moving to and from a bed to a chair (including a wheelchair)?: A Little Help needed standing up from a chair using your arms (e.g., wheelchair or bedside chair)?: A Little Help needed to walk in hospital room?: A Lot Help needed climbing 3-5 steps with a railing? : A Lot 6 Click Score: 17    End of Session Equipment Utilized During Treatment: Gait belt Activity Tolerance: Patient tolerated treatment well Patient left: in bed;with call bell/phone within reach;with bed alarm set Nurse Communication: Mobility status;Other (comment) (elevated BP) PT Visit Diagnosis: Other abnormalities of gait and mobility (R26.89);Difficulty in walking, not elsewhere classified (R26.2)     Time: [id number] PT Time Calculation (min) (ACUTE ONLY): 27 min  Charges:  $Gait Training: 23-37 mins                     Kynzli Rease P., PTA Acute Rehabilitation Services Secure Chat Preferred 9a-5:30pm Office: [place] 11/15/2022, 2:30 PM

## 2022-11-15 NOTE — Progress Notes (Signed)
FMTS Interim Progress Note  Contacted by RN that patient's wife had questions about discharge and stroke work up. Patient's wife update on plan yesterday by Dr. Sabra Heck and discussed that stroke work up is not indicated for this patient. No changes to plan this AM and patient updated about discharge at bedside. Patient with capacity and appropriately engaging in care. Patient did not indicate he would like medical team to speak with his wife at any point during hospital admission. He is being discharged home in stable condition.  Colletta Maryland, MD 11/15/2022, 12:59 PM PGY-1, [place] Medicine Service pager [phone]

## 2022-11-18 LAB — CULTURE, BLOOD (ROUTINE X 2)
Culture: NO GROWTH
Culture: NO GROWTH
Special Requests: ADEQUATE
Special Requests: ADEQUATE

## 2023-08-21 ENCOUNTER — Ambulatory Visit: Payer: Medicare (Managed Care) | Admitting: Nurse Practitioner

## 2023-10-29 ENCOUNTER — Other Ambulatory Visit: Payer: Self-pay | Admitting: Nurse Practitioner

## 2023-10-29 DIAGNOSIS — R413 Other amnesia: Secondary | ICD-10-CM

## 2023-11-24 ENCOUNTER — Other Ambulatory Visit: Payer: Medicare (Managed Care)

## 2023-11-29 DIAGNOSIS — R413 Other amnesia: Secondary | ICD-10-CM | POA: Diagnosis not present

## 2023-11-29 DIAGNOSIS — Z0000 Encounter for general adult medical examination without abnormal findings: Secondary | ICD-10-CM | POA: Diagnosis not present

## 2023-11-29 DIAGNOSIS — Z139 Encounter for screening, unspecified: Secondary | ICD-10-CM | POA: Diagnosis not present

## 2023-11-29 DIAGNOSIS — F12988 Cannabis use, unspecified with other cannabis-induced disorder: Secondary | ICD-10-CM | POA: Diagnosis not present

## 2023-11-29 DIAGNOSIS — L309 Dermatitis, unspecified: Secondary | ICD-10-CM | POA: Diagnosis not present

## 2024-02-28 ENCOUNTER — Telehealth: Payer: Self-pay | Admitting: Internal Medicine

## 2024-02-28 NOTE — Telephone Encounter (Signed)
 Copied from CRM [id number]. Topic: General - Other >> Feb 26, 2024  4:39 PM DeAngela L wrote:  Reason for CRM: chronic benefits will send a fax to confirm patients diagnosis so he can keep his benefits in the program  Phone [phone] fax [phone]

## 2024-03-10 DEATH — deceased
